# Patient Record
Sex: Male | Born: 1955 | Race: Black or African American | Hispanic: No | Marital: Single | Smoking: Current every day smoker
Health system: Southern US, Community
[De-identification: ages and names within clinical notes are randomized; demographics above are authoritative.]

## PROBLEM LIST (undated history)

## (undated) DIAGNOSIS — K8020 Calculus of gallbladder without cholecystitis without obstruction: Secondary | ICD-10-CM

## (undated) HISTORY — PX: CHEST TUBE INSERTION: SHX231

---

## 1999-10-04 ENCOUNTER — Emergency Department (HOSPITAL_COMMUNITY): Admission: EM | Admit: 1999-10-04 | Discharge: 1999-10-04 | Payer: Self-pay | Admitting: Emergency Medicine

## 1999-10-04 ENCOUNTER — Encounter: Payer: Self-pay | Admitting: Emergency Medicine

## 2001-03-08 ENCOUNTER — Emergency Department (HOSPITAL_COMMUNITY): Admission: EM | Admit: 2001-03-08 | Discharge: 2001-03-08 | Payer: Self-pay | Admitting: Emergency Medicine

## 2001-03-08 ENCOUNTER — Encounter: Payer: Self-pay | Admitting: Emergency Medicine

## 2001-03-10 ENCOUNTER — Emergency Department (HOSPITAL_COMMUNITY): Admission: EM | Admit: 2001-03-10 | Discharge: 2001-03-10 | Payer: Self-pay | Admitting: Emergency Medicine

## 2010-08-27 ENCOUNTER — Emergency Department (HOSPITAL_COMMUNITY)
Admission: EM | Admit: 2010-08-27 | Discharge: 2010-08-28 | Disposition: A | Discharge status: Home / Self Care | Payer: Self-pay | Attending: Emergency Medicine | Admitting: Emergency Medicine

## 2010-08-27 ENCOUNTER — Emergency Department (HOSPITAL_COMMUNITY): Payer: Self-pay

## 2010-08-27 DIAGNOSIS — F1010 Alcohol abuse, uncomplicated: Secondary | ICD-10-CM | POA: Insufficient documentation

## 2010-08-27 DIAGNOSIS — S0990XA Unspecified injury of head, initial encounter: Secondary | ICD-10-CM | POA: Insufficient documentation

## 2010-08-27 DIAGNOSIS — S0100XA Unspecified open wound of scalp, initial encounter: Secondary | ICD-10-CM | POA: Insufficient documentation

## 2010-10-09 ENCOUNTER — Emergency Department (HOSPITAL_COMMUNITY)
Admission: EM | Admit: 2010-10-09 | Discharge: 2010-10-09 | Disposition: A | Discharge status: Home / Self Care | Payer: Self-pay | Attending: Emergency Medicine | Admitting: Emergency Medicine

## 2010-10-09 DIAGNOSIS — Z4802 Encounter for removal of sutures: Secondary | ICD-10-CM | POA: Insufficient documentation

## 2012-12-04 ENCOUNTER — Emergency Department (HOSPITAL_COMMUNITY): Payer: Self-pay

## 2012-12-04 ENCOUNTER — Encounter (HOSPITAL_COMMUNITY): Payer: Self-pay | Admitting: *Deleted

## 2012-12-04 ENCOUNTER — Emergency Department (HOSPITAL_COMMUNITY)
Admission: EM | Admit: 2012-12-04 | Discharge: 2012-12-04 | Disposition: A | Discharge status: Home / Self Care | Payer: Self-pay | Attending: Emergency Medicine | Admitting: Emergency Medicine

## 2012-12-04 DIAGNOSIS — K8020 Calculus of gallbladder without cholecystitis without obstruction: Secondary | ICD-10-CM

## 2012-12-04 DIAGNOSIS — F17200 Nicotine dependence, unspecified, uncomplicated: Secondary | ICD-10-CM | POA: Insufficient documentation

## 2012-12-04 HISTORY — DX: Calculus of gallbladder without cholecystitis without obstruction: K80.20

## 2012-12-04 LAB — COMPREHENSIVE METABOLIC PANEL
BUN: 8 mg/dL (ref 6–23)
CO2: 28 mEq/L (ref 19–32)
Chloride: 98 mEq/L (ref 96–112)
Creatinine, Ser: 0.88 mg/dL (ref 0.50–1.35)
GFR calc non Af Amer: >90 mL/min (ref >90)
Total Bilirubin: 0.9 mg/dL (ref 0.3–1.2)

## 2012-12-04 LAB — CBC WITH DIFFERENTIAL/PLATELET
HCT: 39 % (ref 39.0–52.0)
Hemoglobin: 14 g/dL (ref 13.0–17.0)
Lymphocytes Relative: 30 % (ref 12–46)
Lymphs Abs: 1.8 10*3/uL (ref 0.7–4.0)
MCHC: 35.9 g/dL (ref 30.0–36.0)
Monocytes Absolute: 1.5 10*3/uL — ABNORMAL HIGH (ref 0.1–1.0)
Monocytes Relative: 24 % — ABNORMAL HIGH (ref 3–12)
Neutro Abs: 2.7 10*3/uL (ref 1.7–7.7)
WBC: 6 10*3/uL (ref 4.0–10.5)

## 2012-12-04 LAB — URINE MICROSCOPIC-ADD ON

## 2012-12-04 LAB — URINALYSIS, ROUTINE W REFLEX MICROSCOPIC
Bilirubin Urine: NEGATIVE
Glucose, UA: NEGATIVE mg/dL
Ketones, ur: NEGATIVE mg/dL
Leukocytes, UA: NEGATIVE
Nitrite: NEGATIVE
Protein, ur: 30 mg/dL — AB

## 2012-12-04 LAB — LIPASE, BLOOD: Lipase: 30 U/L (ref 11–59)

## 2012-12-04 LAB — TROPONIN I: Troponin I: <0.3 ng/mL (ref <0.30)

## 2012-12-04 MED ORDER — OXYCODONE HCL 5 MG PO TABS: 5.0000 mg | ORAL_TABLET | ORAL | Status: DC | PRN

## 2012-12-04 MED ORDER — HYDROMORPHONE HCL PF 1 MG/ML IJ SOLN
1.0000 mg | Freq: Once | INTRAMUSCULAR | Status: AC
Start: 2012-12-04 — End: 2012-12-04
  Administered 2012-12-04: 1 mg via INTRAVENOUS
  Filled 2012-12-04: qty 1

## 2012-12-04 MED ORDER — ONDANSETRON HCL 4 MG/2ML IJ SOLN
4.0000 mg | Freq: Once | INTRAMUSCULAR | Status: AC
  Administered 2012-12-04: 4 mg via INTRAVENOUS
  Filled 2012-12-04: qty 2

## 2012-12-04 NOTE — ED Notes (Signed)
RUQ pain that started Sunday and then mowed yesterday and pain increased.  No nvd or constipation or chest pain

## 2012-12-04 NOTE — ED Provider Notes (Signed)
History    CSN: [id number] Arrival date & time 12/04/12  6644  First MD Initiated Contact with Patient 12/04/12 0805     Chief Complaint  Patient presents with  . Abdominal Pain   (Consider location/radiation/quality/duration/timing/severity/associated sxs/prior Treatment) HPI Comments: Patient presents with a chief complaint of RUQ abdominal pain.  Pain has been present for the past 3 days.  He reports that the pain has been constant.  Pain radiates to his right shoulder.  He states that nothing makes the pain better and nothing makes the pain worse.  He has not taken anything for pain prior to arrival.  He has not noticed any association with eating or anything else.  He has not tried taking anything for the pain prior to arrival.  He reports that he has never had pain like this before.  He denies nausea, vomiting, or diarrhea.  Denies chest pain or SOB.  He denies fever or chills.  Denies urinary symptoms.  He denies prior history of Gallstones.  Patient does report that he drinks approximately 80 ounces of beer daily.  The history is provided by the patient.   History reviewed. No pertinent past medical history. History reviewed. No pertinent past surgical history. No family history on file. History  Substance Use Topics  . Smoking status: Current Every Day Smoker  . Smokeless tobacco: Not on file  . Alcohol Use: Yes     Comment: daily    Review of Systems  Gastrointestinal: Positive for abdominal pain.  All other systems reviewed and are negative.    Allergies  Review of patient's allergies indicates no known allergies.  Home Medications  No current outpatient prescriptions on file. BP 134/76  Pulse 95  Temp(Src) 98.1 F (36.7 C) (Oral)  Resp 20  SpO2 94% Physical Exam  Nursing note and vitals reviewed. Constitutional: He appears well-developed and well-nourished. No distress.  HENT:  Head: Normocephalic and atraumatic.  Mouth/Throat: Oropharynx is clear and  moist.  Neck: Normal range of motion. Neck supple.  Cardiovascular: Normal rate, regular rhythm and normal heart sounds.   Pulmonary/Chest: Effort normal and breath sounds normal.  Abdominal: Soft. Normal appearance and bowel sounds are normal. He exhibits no distension and no mass. There is tenderness in the right upper quadrant. There is positive Murphy's sign. There is no rigidity, no rebound, no guarding and no tenderness at McBurney's point.  Neurological: He is alert.  Skin: Skin is warm and dry. He is not diaphoretic.  Psychiatric: He has a normal mood and affect.    ED Course  Procedures (including critical care time) Labs Reviewed  CBC WITH DIFFERENTIAL  COMPREHENSIVE METABOLIC PANEL  LIPASE, BLOOD  URINALYSIS, ROUTINE W REFLEX MICROSCOPIC   US Abdomen Complete  12/04/2012   *RADIOLOGY REPORT*  Clinical Data:  Right upper quadrant abdominal pain.  The patient confirms alcohol consumption.  COMPLETE ABDOMINAL ULTRASOUND  Comparison:  None.  Findings:  Gallbladder:  Tumefactive sludge and scattered tiny shadowing gallstones.  No gallbladder wall thickening or pericholecystic fluid.  Negative sonographic Murphy's sign according to the ultrasound technologist.  Common bile duct:  Normal in caliber with maximum diameter approximating 5 mm.  Liver:  Diffusely increased and coarsened echotexture.  Numerous foci of heterogeneous echotexture in both lobes of the liver, the largest mass in the right lobe measuring approximately 6.3 x 5.4 x 5.6 cm.  Patent portal vein with hepatopetal flow.  IVC:  Patent.  Pancreas:  Although the pancreas is difficult to visualize in  its entirety, no focal pancreatic abnormality is identified.  Spleen:  Normal size and echotexture without focal parenchymal abnormality.  Right Kidney:  No hydronephrosis.  Well-preserved cortex.  No shadowing calculi.  Normal size and parenchymal echotexture without focal abnormalities.  Approximately 10.1 cm in length.  Left Kidney:   No hydronephrosis.  Well-preserved cortex.  No shadowing calculi.  Normal size and parenchymal echotexture without focal abnormalities.  Approximately 10.2 cm in length.  Abdominal aorta:  Normal in caliber throughout its visualized course in the abdomen without significant atherosclerosis.  The distal abdominal aorta was obscured by overlying bowel gas.  IMPRESSION:  1.  Tumefactive sludge and tiny gallstones within the gallbladder. No sonographic evidence of acute cholecystitis and no biliary ductal dilation. 2.  Hepatic steatosis.  Multiple hepatic masses versus multiple areas of focal fatty sparing throughout both lobes of the liver. CT of the abdomen with contrast may be helpful to make this determination. 3.  Remainder of the examination normal with a caveat that the distal abdominal aorta was obscured by overlying bowel gas and was therefore not evaluated.   Original Report Authenticated By: Hulan Saas, M.D.   No diagnosis found.   Date: 12/04/2012  Rate: 90  Rhythm: normal sinus rhythm  QRS Axis: normal  Intervals: normal  ST/T Wave abnormalities: normal  Conduction Disutrbances:none  Narrative Interpretation:   Old EKG Reviewed: none available    MDM  Patient presenting with RUQ abdominal pain that has been present for the past 3 days.  Labs unremarkable aside from elevated liver enzymes, which is consistent with the patient's history of Alcoholism.  Lipase WNL.  Troponin negative.  Patient afebrile.  Abdominal ultrasound results as above showing Gallstones without evidence of Cholecystitis and also hepatic masses vs multiple areas of focal fatty sparing throughout both lobes of the liver.      Pascal Lux [place], PA-C 12/04/12 1615  Pascal Lux [place], PA-C 12/04/12 1616

## 2012-12-06 NOTE — ED Provider Notes (Signed)
Medical screening examination/treatment/procedure(s) were conducted as a shared visit with non-physician practitioner(s) and myself.  I personally evaluated the patient during the encounter  Patient feels much better after treatment in the emergency department.  Patient with cholelithiasis without signs of cholecystitis.  Discharge home with general surgery followup  Lyanne Co, MD 12/06/12 2342

## 2013-05-30 ENCOUNTER — Encounter (HOSPITAL_COMMUNITY): Payer: Self-pay | Admitting: Emergency Medicine

## 2013-05-30 ENCOUNTER — Emergency Department (HOSPITAL_COMMUNITY): Payer: Medicaid Other

## 2013-05-30 ENCOUNTER — Inpatient Hospital Stay (HOSPITAL_COMMUNITY)
Admission: EM | Admit: 2013-05-30 | Discharge: 2013-06-17 | DRG: 981 | Disposition: A | Discharge status: Hospice | Payer: Medicaid Other | Attending: Internal Medicine | Admitting: Internal Medicine

## 2013-05-30 ENCOUNTER — Inpatient Hospital Stay (HOSPITAL_COMMUNITY): Payer: Medicaid Other

## 2013-05-30 DIAGNOSIS — K649 Unspecified hemorrhoids: Secondary | ICD-10-CM | POA: Diagnosis not present

## 2013-05-30 DIAGNOSIS — Z8249 Family history of ischemic heart disease and other diseases of the circulatory system: Secondary | ICD-10-CM

## 2013-05-30 DIAGNOSIS — E43 Unspecified severe protein-calorie malnutrition: Secondary | ICD-10-CM | POA: Diagnosis present

## 2013-05-30 DIAGNOSIS — E46 Unspecified protein-calorie malnutrition: Secondary | ICD-10-CM | POA: Diagnosis present

## 2013-05-30 DIAGNOSIS — C7889 Secondary malignant neoplasm of other digestive organs: Secondary | ICD-10-CM | POA: Diagnosis present

## 2013-05-30 DIAGNOSIS — R160 Hepatomegaly, not elsewhere classified: Secondary | ICD-10-CM | POA: Diagnosis present

## 2013-05-30 DIAGNOSIS — R578 Other shock: Secondary | ICD-10-CM

## 2013-05-30 DIAGNOSIS — E162 Hypoglycemia, unspecified: Secondary | ICD-10-CM | POA: Diagnosis not present

## 2013-05-30 DIAGNOSIS — E874 Mixed disorder of acid-base balance: Secondary | ICD-10-CM | POA: Diagnosis present

## 2013-05-30 DIAGNOSIS — I10 Essential (primary) hypertension: Secondary | ICD-10-CM | POA: Diagnosis present

## 2013-05-30 DIAGNOSIS — E872 Acidosis, unspecified: Secondary | ICD-10-CM | POA: Diagnosis present

## 2013-05-30 DIAGNOSIS — R791 Abnormal coagulation profile: Secondary | ICD-10-CM | POA: Diagnosis present

## 2013-05-30 DIAGNOSIS — E871 Hypo-osmolality and hyponatremia: Secondary | ICD-10-CM | POA: Diagnosis present

## 2013-05-30 DIAGNOSIS — K766 Portal hypertension: Secondary | ICD-10-CM | POA: Diagnosis present

## 2013-05-30 DIAGNOSIS — F10939 Alcohol use, unspecified with withdrawal, unspecified: Secondary | ICD-10-CM | POA: Diagnosis present

## 2013-05-30 DIAGNOSIS — R680 Hypothermia, not associated with low environmental temperature: Secondary | ICD-10-CM | POA: Diagnosis not present

## 2013-05-30 DIAGNOSIS — R180 Malignant ascites: Secondary | ICD-10-CM | POA: Diagnosis present

## 2013-05-30 DIAGNOSIS — Z66 Do not resuscitate: Secondary | ICD-10-CM | POA: Diagnosis not present

## 2013-05-30 DIAGNOSIS — R7401 Elevation of levels of liver transaminase levels: Secondary | ICD-10-CM | POA: Diagnosis present

## 2013-05-30 DIAGNOSIS — R1011 Right upper quadrant pain: Secondary | ICD-10-CM | POA: Diagnosis present

## 2013-05-30 DIAGNOSIS — E870 Hyperosmolality and hypernatremia: Secondary | ICD-10-CM | POA: Diagnosis not present

## 2013-05-30 DIAGNOSIS — B1920 Unspecified viral hepatitis C without hepatic coma: Secondary | ICD-10-CM | POA: Diagnosis present

## 2013-05-30 DIAGNOSIS — C787 Secondary malignant neoplasm of liver and intrahepatic bile duct: Secondary | ICD-10-CM

## 2013-05-30 DIAGNOSIS — R490 Dysphonia: Secondary | ICD-10-CM | POA: Diagnosis not present

## 2013-05-30 DIAGNOSIS — E860 Dehydration: Secondary | ICD-10-CM

## 2013-05-30 DIAGNOSIS — Z801 Family history of malignant neoplasm of trachea, bronchus and lung: Secondary | ICD-10-CM

## 2013-05-30 DIAGNOSIS — N10 Acute tubulo-interstitial nephritis: Secondary | ICD-10-CM | POA: Diagnosis present

## 2013-05-30 DIAGNOSIS — G9340 Encephalopathy, unspecified: Secondary | ICD-10-CM | POA: Diagnosis present

## 2013-05-30 DIAGNOSIS — C772 Secondary and unspecified malignant neoplasm of intra-abdominal lymph nodes: Secondary | ICD-10-CM | POA: Diagnosis present

## 2013-05-30 DIAGNOSIS — Z7189 Other specified counseling: Secondary | ICD-10-CM

## 2013-05-30 DIAGNOSIS — N179 Acute kidney failure, unspecified: Secondary | ICD-10-CM | POA: Diagnosis not present

## 2013-05-30 DIAGNOSIS — E876 Hypokalemia: Secondary | ICD-10-CM | POA: Diagnosis not present

## 2013-05-30 DIAGNOSIS — K769 Liver disease, unspecified: Secondary | ICD-10-CM | POA: Diagnosis present

## 2013-05-30 DIAGNOSIS — D649 Anemia, unspecified: Secondary | ICD-10-CM | POA: Diagnosis present

## 2013-05-30 DIAGNOSIS — IMO0002 Reserved for concepts with insufficient information to code with codable children: Secondary | ICD-10-CM

## 2013-05-30 DIAGNOSIS — F1020 Alcohol dependence, uncomplicated: Secondary | ICD-10-CM | POA: Diagnosis present

## 2013-05-30 DIAGNOSIS — Z23 Encounter for immunization: Secondary | ICD-10-CM

## 2013-05-30 DIAGNOSIS — K5900 Constipation, unspecified: Secondary | ICD-10-CM | POA: Diagnosis not present

## 2013-05-30 DIAGNOSIS — K652 Spontaneous bacterial peritonitis: Secondary | ICD-10-CM | POA: Diagnosis present

## 2013-05-30 DIAGNOSIS — I4729 Other ventricular tachycardia: Secondary | ICD-10-CM | POA: Diagnosis not present

## 2013-05-30 DIAGNOSIS — F17200 Nicotine dependence, unspecified, uncomplicated: Secondary | ICD-10-CM | POA: Diagnosis present

## 2013-05-30 DIAGNOSIS — K7689 Other specified diseases of liver: Secondary | ICD-10-CM | POA: Diagnosis present

## 2013-05-30 DIAGNOSIS — R5381 Other malaise: Secondary | ICD-10-CM | POA: Diagnosis not present

## 2013-05-30 DIAGNOSIS — D62 Acute posthemorrhagic anemia: Secondary | ICD-10-CM | POA: Diagnosis not present

## 2013-05-30 DIAGNOSIS — I472 Ventricular tachycardia, unspecified: Secondary | ICD-10-CM | POA: Diagnosis not present

## 2013-05-30 DIAGNOSIS — J9600 Acute respiratory failure, unspecified whether with hypoxia or hypercapnia: Secondary | ICD-10-CM | POA: Diagnosis present

## 2013-05-30 DIAGNOSIS — Z79899 Other long term (current) drug therapy: Secondary | ICD-10-CM

## 2013-05-30 DIAGNOSIS — E2749 Other adrenocortical insufficiency: Secondary | ICD-10-CM | POA: Diagnosis present

## 2013-05-30 DIAGNOSIS — G4700 Insomnia, unspecified: Secondary | ICD-10-CM | POA: Diagnosis not present

## 2013-05-30 DIAGNOSIS — Z598 Other problems related to housing and economic circumstances: Secondary | ICD-10-CM

## 2013-05-30 DIAGNOSIS — Z5987 Material hardship due to limited financial resources, not elsewhere classified: Secondary | ICD-10-CM

## 2013-05-30 DIAGNOSIS — F1410 Cocaine abuse, uncomplicated: Secondary | ICD-10-CM | POA: Diagnosis present

## 2013-05-30 DIAGNOSIS — Z515 Encounter for palliative care: Secondary | ICD-10-CM

## 2013-05-30 DIAGNOSIS — I469 Cardiac arrest, cause unspecified: Secondary | ICD-10-CM | POA: Diagnosis present

## 2013-05-30 DIAGNOSIS — G9349 Other encephalopathy: Secondary | ICD-10-CM | POA: Diagnosis present

## 2013-05-30 DIAGNOSIS — F10239 Alcohol dependence with withdrawal, unspecified: Secondary | ICD-10-CM | POA: Diagnosis present

## 2013-05-30 DIAGNOSIS — Z833 Family history of diabetes mellitus: Secondary | ICD-10-CM

## 2013-05-30 DIAGNOSIS — C228 Malignant neoplasm of liver, primary, unspecified as to type: Principal | ICD-10-CM | POA: Diagnosis present

## 2013-05-30 DIAGNOSIS — K229 Disease of esophagus, unspecified: Secondary | ICD-10-CM | POA: Diagnosis present

## 2013-05-30 HISTORY — DX: Calculus of gallbladder without cholecystitis without obstruction: K80.20

## 2013-05-30 LAB — LACTATE DEHYDROGENASE: LDH: 1221 U/L — ABNORMAL HIGH (ref 94–250)

## 2013-05-30 LAB — HEPATITIS PANEL, ACUTE
HCV Ab: REACTIVE — AB
Hep A IgM: NONREACTIVE
Hep B C IgM: NONREACTIVE
Hepatitis B Surface Ag: NEGATIVE

## 2013-05-30 LAB — IRON AND TIBC: Saturation Ratios: 23 % (ref 20–55)

## 2013-05-30 LAB — BASIC METABOLIC PANEL WITH GFR
BUN: 28 mg/dL — ABNORMAL HIGH (ref 6–23)
CO2: 15 meq/L — ABNORMAL LOW (ref 19–32)
Calcium: 8.3 mg/dL — ABNORMAL LOW (ref 8.4–10.5)
Chloride: 100 meq/L (ref 96–112)
Creatinine, Ser: 1.36 mg/dL — ABNORMAL HIGH (ref 0.50–1.35)
GFR calc Af Amer: 65 mL/min — ABNORMAL LOW (ref >90)
GFR calc non Af Amer: 56 mL/min — ABNORMAL LOW (ref >90)
Glucose, Bld: 100 mg/dL — ABNORMAL HIGH (ref 70–99)
Potassium: 6 meq/L — ABNORMAL HIGH (ref 3.5–5.1)
Sodium: 130 meq/L — ABNORMAL LOW (ref 135–145)

## 2013-05-30 LAB — CBC
HCT: 18.1 % — ABNORMAL LOW (ref 39.0–52.0)
HCT: 25.6 % — ABNORMAL LOW (ref 39.0–52.0)
Hemoglobin: 6.4 g/dL — CL (ref 13.0–17.0)
Hemoglobin: 8.9 g/dL — ABNORMAL LOW (ref 13.0–17.0)
MCH: 31.8 pg (ref 26.0–34.0)
MCHC: 34.8 g/dL (ref 30.0–36.0)
MCV: 91.4 fL (ref 78.0–100.0)
MCV: 91.4 fL (ref 78.0–100.0)
Platelets: 317 10*3/uL (ref 150–400)
Platelets: 267 K/uL (ref 150–400)
RBC: 1.98 MIL/uL — ABNORMAL LOW (ref 4.22–5.81)
RBC: 2.8 MIL/uL — ABNORMAL LOW (ref 4.22–5.81)
RDW: 17.8 % — ABNORMAL HIGH (ref 11.5–15.5)
RDW: 15.9 % — ABNORMAL HIGH (ref 11.5–15.5)
WBC: 9 10*3/uL (ref 4.0–10.5)
WBC: 13.3 K/uL — ABNORMAL HIGH (ref 4.0–10.5)

## 2013-05-30 LAB — COMPREHENSIVE METABOLIC PANEL
ALT: 76 U/L — ABNORMAL HIGH (ref 0–53)
AST: 245 U/L — ABNORMAL HIGH (ref 0–37)
Alkaline Phosphatase: 682 U/L — ABNORMAL HIGH (ref 39–117)
GFR calc Af Amer: 77 mL/min — ABNORMAL LOW (ref >90)
Glucose, Bld: 106 mg/dL — ABNORMAL HIGH (ref 70–99)
Potassium: 4.7 mEq/L (ref 3.5–5.1)
Sodium: 129 mEq/L — ABNORMAL LOW (ref 135–145)
Total Protein: 8.2 g/dL (ref 6.0–8.3)

## 2013-05-30 LAB — CBC WITH DIFFERENTIAL/PLATELET
Basophils Absolute: 0 10*3/uL (ref 0.0–0.1)
Basophils Relative: 0 % (ref 0–1)
Eosinophils Absolute: 0 10*3/uL (ref 0.0–0.7)
Eosinophils Relative: 0 % (ref 0–5)
HCT: 20.7 % — ABNORMAL LOW (ref 39.0–52.0)
Hemoglobin: 7.3 g/dL — ABNORMAL LOW (ref 13.0–17.0)
MCH: 32.3 pg (ref 26.0–34.0)
MCHC: 35.3 g/dL (ref 30.0–36.0)
MCV: 91.6 fL (ref 78.0–100.0)
Monocytes Absolute: 0.9 10*3/uL (ref 0.1–1.0)
Monocytes Relative: 11 % (ref 3–12)
Neutro Abs: 6.5 10*3/uL (ref 1.7–7.7)
RDW: 17.5 % — ABNORMAL HIGH (ref 11.5–15.5)

## 2013-05-30 LAB — PREPARE RBC (CROSSMATCH)

## 2013-05-30 LAB — ABO/RH: ABO/RH(D): O POS

## 2013-05-30 LAB — RETICULOCYTES
RBC.: 1.98 MIL/uL — ABNORMAL LOW (ref 4.22–5.81)
Retic Count, Absolute: 132.7 10*3/uL (ref 19.0–186.0)

## 2013-05-30 LAB — APTT: aPTT: 32 s (ref 24–37)

## 2013-05-30 LAB — TECHNOLOGIST SMEAR REVIEW

## 2013-05-30 LAB — LACTIC ACID, PLASMA: Lactic Acid, Venous: 5.1 mmol/L — ABNORMAL HIGH (ref 0.5–2.2)

## 2013-05-30 LAB — MRSA PCR SCREENING: MRSA by PCR: NEGATIVE

## 2013-05-30 LAB — PROTIME-INR
INR: 1.17 (ref 0.00–1.49)
Prothrombin Time: 14.7 seconds (ref 11.6–15.2)

## 2013-05-30 LAB — HIV ANTIBODY (ROUTINE TESTING W REFLEX): HIV: NONREACTIVE

## 2013-05-30 MED ORDER — LORAZEPAM 2 MG/ML IJ SOLN: 1.0000 mg | Freq: Four times a day (QID) | INTRAMUSCULAR | Status: DC | PRN

## 2013-05-30 MED ORDER — ONDANSETRON HCL 4 MG/2ML IJ SOLN
4.0000 mg | Freq: Once | INTRAMUSCULAR | Status: AC
  Administered 2013-05-30: 4 mg via INTRAVENOUS
  Filled 2013-05-30: qty 2

## 2013-05-30 MED ORDER — PNEUMOCOCCAL VAC POLYVALENT 25 MCG/0.5ML IJ INJ
0.5000 mL | INJECTION | INTRAMUSCULAR | Status: AC
  Administered 2013-05-31: 0.5 mL via INTRAMUSCULAR
  Filled 2013-05-30: qty 0.5

## 2013-05-30 MED ORDER — LORAZEPAM 1 MG PO TABS: 1.0000 mg | ORAL_TABLET | Freq: Four times a day (QID) | ORAL | Status: DC | PRN

## 2013-05-30 MED ORDER — SODIUM CHLORIDE 0.9 % IV SOLN
INTRAVENOUS | Status: AC
  Administered 2013-05-30: 14:00:00 via INTRAVENOUS

## 2013-05-30 MED ORDER — SODIUM CHLORIDE 0.9 % IV SOLN
2.0000 g | Freq: Once | INTRAVENOUS | Status: DC
  Filled 2013-05-30: qty 20

## 2013-05-30 MED ORDER — FOLIC ACID 1 MG PO TABS
1.0000 mg | ORAL_TABLET | Freq: Every day | ORAL | Status: DC
  Administered 2013-05-30 – 2013-06-04 (×5): 1 mg via ORAL
  Filled 2013-05-30 (×6): qty 1

## 2013-05-30 MED ORDER — SODIUM CHLORIDE 0.9 % IJ SOLN
3.0000 mL | INTRAMUSCULAR | Status: DC | PRN
  Administered 2013-06-06 (×2): 3 mL via INTRAVENOUS

## 2013-05-30 MED ORDER — ADULT MULTIVITAMIN W/MINERALS CH
1.0000 | ORAL_TABLET | Freq: Every day | ORAL | Status: DC
  Administered 2013-05-30 – 2013-06-04 (×5): 1 via ORAL
  Filled 2013-05-30 (×6): qty 1

## 2013-05-30 MED ORDER — SODIUM CHLORIDE 0.9 % IV SOLN: 250.0000 mL | INTRAVENOUS | Status: DC | PRN

## 2013-05-30 MED ORDER — ENOXAPARIN SODIUM 40 MG/0.4ML ~~LOC~~ SOLN
40.0000 mg | SUBCUTANEOUS | Status: DC
  Filled 2013-05-30: qty 0.4

## 2013-05-30 MED ORDER — IOHEXOL 300 MG/ML  SOLN
100.0000 mL | Freq: Once | INTRAMUSCULAR | Status: AC | PRN
  Administered 2013-05-30: 100 mL via INTRAVENOUS

## 2013-05-30 MED ORDER — MORPHINE SULFATE 2 MG/ML IJ SOLN
2.0000 mg | INTRAMUSCULAR | Status: DC | PRN
  Administered 2013-05-30 – 2013-06-01 (×3): 2 mg via INTRAVENOUS
  Filled 2013-05-30 (×3): qty 1

## 2013-05-30 MED ORDER — THIAMINE HCL 100 MG/ML IJ SOLN
100.0000 mg | Freq: Every day | INTRAMUSCULAR | Status: DC
  Administered 2013-05-30 – 2013-06-04 (×2): 100 mg via INTRAVENOUS
  Filled 2013-05-30 (×6): qty 1

## 2013-05-30 MED ORDER — IOHEXOL 300 MG/ML  SOLN
25.0000 mL | INTRAMUSCULAR | Status: AC
  Administered 2013-05-30 (×2): 25 mL via ORAL

## 2013-05-30 MED ORDER — SODIUM CHLORIDE 0.9 % IV SOLN
INTRAVENOUS | Status: DC
  Administered 2013-05-30: 12:00:00 via INTRAVENOUS

## 2013-05-30 MED ORDER — MORPHINE SULFATE 4 MG/ML IJ SOLN
4.0000 mg | Freq: Once | INTRAMUSCULAR | Status: AC
  Administered 2013-05-30: 4 mg via INTRAVENOUS
  Filled 2013-05-30: qty 1

## 2013-05-30 MED ORDER — VITAMIN B-1 100 MG PO TABS
100.0000 mg | ORAL_TABLET | Freq: Every day | ORAL | Status: DC
  Administered 2013-05-31 – 2013-06-03 (×3): 100 mg via ORAL
  Filled 2013-05-30 (×6): qty 1

## 2013-05-30 MED ORDER — SODIUM CHLORIDE 0.9 % IJ SOLN
3.0000 mL | Freq: Two times a day (BID) | INTRAMUSCULAR | Status: DC
  Administered 2013-05-30 – 2013-06-17 (×29): 3 mL via INTRAVENOUS

## 2013-05-30 NOTE — Progress Notes (Signed)
Moved pt to 2C 01. Report given.  Peter Congo RN
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
## 2013-05-30 NOTE — ED Notes (Signed)
Abdominal pain with n/v began 6 months ago

## 2013-05-30 NOTE — Progress Notes (Signed)
Micheal Daniel [id number] Admission Data: 05/30/2013 1:56 PM Attending Provider: Farley Ly, MD  PCP:No PCP Per Patient Consults/ Treatment Team:    Micheal Daniel is a 57 y.o. male patient admitted from ED awake, alert  & orientated  X 3,  Full Code, VSS - Blood pressure 94/63, pulse 87, temperature 97.8 F (36.6 C), temperature source Oral, resp. rate 20, SpO2 100.00%.,, no c/o shortness of breath, no c/o chest pain, no distress noted.  IV site WDL:  antecubital right, condition patent and no redness with a transparent dsg that's clean dry and intact.  Allergies:  No Known Allergies   Past Medical History  Diagnosis Date  . Cholelithiasis 12/04/12    ED visit for RUQ abd pain, was supposed to follow up with gen surg, but lost to follow up    History:  obtained from the patient.   Pt orientation to unit, room and routine. Information packet given to patient/family and safety video watched.  Admission INP armband ID verified with patient/family, and in place. SR up x 2, fall risk assessment complete with Patient and family verbalizing understanding of risks associated with falls. Pt verbalizes an understanding of how to use the call bell and to call for help before getting out of bed.  Skin, clean-dry- intact without evidence of bruising,  skin tear on left wrist.        Will cont to monitor and assist as needed.  Cindra Eves, RN 05/30/2013 1:56 PM

## 2013-05-30 NOTE — H&P (Signed)
Internal Medicine Attending Admission Note Date: 05/30/2013  Patient name: Micheal Daniel Medical record number: [id number] Date of birth: [birth year] Age: 57 y.o. Gender: male  I saw and evaluated the patient. I reviewed the resident's note and I agree with the resident's findings and plan as documented in the resident's note, with the following additional comments..  Chief Complaint(s): Right upper quadrant abdominal pain  History - key components related to admission: Patient is a 57 year old man with history of cholelithiasis and other problems as outlined in the medical history, admitted with complaint of right upper quadrant pain.  Patient says that he was seen in the emergency department about 6 months ago for similar pain and diagnosed with gallstones; he reports that he was on an oral pain medication for a short time, but the pain resolved.  The pain recurred about 2 days prior to admission; it was associated with at least one episode of nausea and vomiting.  He denies fever, chills, sweats, hematemesis, bright red blood per rectum, melena, dysuria, or urinary frequency.  He says that he drinks about two 40 ounce beers per day chronically, and has drunk more in the past.  He also smokes, currently about 5 cigarettes per day.  Physical Exam - key components related to admission:  Filed Vitals:   05/30/13 1229 05/30/13 1252 05/30/13 1255 05/30/13 1256  BP: 125/86 128/73 118/78 94/63  Pulse: 97 84 87   Temp: 97.8 F (36.6 C) 97.8 F (36.6 C)    TempSrc: Oral Oral    Resp: 20 20    SpO2: 98% 100%     General: Alert, oriented Lungs: Clear Back: No CVA tenderness Heart: Regular; S1-S2, no S3, no S4, no murmurs Abdomen: Bowel sounds present, soft; moderate right upper quadrant tenderness; liver margin percusses about 6 cm below the right costal margin; no apparent splenomegaly.   Extremities: No edema  Lab results:   Basic Metabolic Panel:  Recent Labs  40/98/11 0850  NA 129*   K 4.7  CL 96  CO2 18*  GLUCOSE 106*  BUN 22  CREATININE 1.18  CALCIUM 9.0    Liver Function Tests:  Recent Labs  05/30/13 0850  AST 245*  ALT 76*  ALKPHOS 682*  BILITOT 2.0*  PROT 8.2  ALBUMIN 2.2*    Recent Labs  05/30/13 0850  LIPASE 23     CBC:  Recent Labs  05/30/13 0850 05/30/13 1350  WBC 8.7 9.0  HGB 7.3* 6.4*  HCT 20.7* 18.1*  MCV 91.6 91.4  PLT 346 317    Recent Labs  05/30/13 0850  NEUTROABS 6.5  LYMPHSABS 1.3  MONOABS 0.9  EOSABS 0.0  BASOSABS 0.0    Anemia Panel:  Recent Labs  05/30/13 1350  TIBC 264  IRON 61  RETICCTPCT 6.7*    Coagulation:  Recent Labs  05/30/13 1633  INR 1.17    HIV  Date Value Range Status  05/30/2013 NON REACTIVE  NON REACTIVE Final     Performed at [facility]     LDH  Date Value Range Status  05/30/2013 1221* 94 - 250 U/L Final     Urinalysis    Component Value Date/Time   COLORURINE AMBER* 12/04/2012 0846   APPEARANCEUR CLEAR 12/04/2012 0846   LABSPEC 1.012 12/04/2012 0846   PHURINE 6.0 12/04/2012 0846   GLUCOSEU NEGATIVE 12/04/2012 0846   HGBUR NEGATIVE 12/04/2012 0846   BILIRUBINUR NEGATIVE 12/04/2012 0846   KETONESUR NEGATIVE 12/04/2012 0846   PROTEINUR 30*  12/04/2012 0846   UROBILINOGEN 1.0 12/04/2012 0846   NITRITE NEGATIVE 12/04/2012 0846   LEUKOCYTESUR NEGATIVE 12/04/2012 0846     Imaging results:  Dg Chest 2 View  05/30/2013   CLINICAL DATA:  Concerning areas found within the liver.  EXAM: CHEST  2 VIEW  COMPARISON:  None.  FINDINGS: Low lung volumes. The cardiac silhouette is in normal limits. Areas of a ill-defined nodular density project within the right mid lung, right upper lobe region, and left lung base. Further evaluation chest CT project fluid considering the liver findings is recommended. No focal regions of consolidation identified. Visualized osseous structures are unremarkable.  IMPRESSION: Multiple areas of ill-defined vague densities with a nodular  appearance. Further evaluation with contrasted chest CT is recommended.   Electronically Signed   By: Salome Holmes M.D.   On: 05/30/2013 14:48   US Abdomen Complete  05/30/2013   CLINICAL DATA:  Pain.  EXAM: ULTRASOUND ABDOMEN COMPLETE  COMPARISON:  12/04/2012 abdominal ultrasound.  FINDINGS: Gallbladder:  Again noted is sludge in the gallbladder. Gallbladder wall thickness is normal at 1.9 mm. Negative Murphy's sign. No pericholecystic fluid.  Common bile duct:  Diameter: 3.1 mm.  Liver:  Echotexture is grossly abnormal with severe heterogeneity. Probable hepatomegaly. There are multiple hepatic masses, many of which are hyperechoic. The largest on today's exam measures 3.9 cm in maximum diameter. These lesions are very suspicious for metastatic disease. Portal vein patent with normal directional flow. CT of the abdomen and pelvis suggested for further evaluation.  IVC:  No abnormality visualized.  Pancreas:  Visualized portion unremarkable.  Spleen:  Size and appearance within normal limits.  Right Kidney:  Length: 9.7 cm. Echogenicity within normal limits. No mass or hydronephrosis visualized.  Left Kidney:  Length: 9.2 cm. Echogenicity within normal limits. No mass or hydronephrosis visualized.  Abdominal aorta:  No aneurysm visualized.  Other findings:  Complex ascites.  This suggests malignant ascites.  IMPRESSION: Innumerable hepatic masses consistent with metastatic disease. Associated ascites present. CT of the abdomen pelvis suggested for further evaluation.   Electronically Signed   By: Maisie Fus  Register   On: 05/30/2013 09:58    Other results: EKG: Sinus rhythm, normal EKG..  Assessment & Plan by Problem:  1.  Right upper quadrant pain, with abdominal ultrasound finding of multiple hepatic masses consistent with metastatic disease.  Plan is CT scan of the abdomen and pelvis; will then consider best approach to obtaining tissue for diagnosis, possibly a CT-guided percutaneous biopsy.  We  discussed the ultrasound findings and the planned workup with the patient.  2.  Vague densities on chest x-ray.  Plan is CT scan of the chest to evaluate.  3.  Elevated anion gap of 15 and low bicarbonate of 18 consistent with metabolic acidosis.  Patient's lactic acid level is elevated, which is likely the source of the acidosis, although there may be a contribution from alcoholic ketoacidosis.  The likely source of the lactic acidosis is the liver process; patient is not hypotensive or hypoxic, and his symptoms do not suggest bowel ischemia.  Plan is follow electrolytes, renal function, and lactic acid level; CT scan of the abdomen and pelvis as above; supportive care.  4.  Normocytic anemia.  Plan is transfuse to hemoglobin greater than 7; follow hemoglobin and transfuse as indicated; check stool for fecal occult blood.  5.  Other problems and plans as per the resident physician's note.

## 2013-05-30 NOTE — Progress Notes (Signed)
CRITICAL VALUE ALERT  Critical value received:  Hgb 6.4  Date of notification:  12/18  Time of notification:  1430  Critical value read back:yes  Nurse who received alert:  Peter Congo  MD notified (1st page):  Delane Ginger  Time of first page:  1443  MD notified (2nd page):   Time of second page:  Responding MD:  Delane Ginger  Time MD responded:  1450

## 2013-05-30 NOTE — ED Provider Notes (Signed)
CSN: [id number]     Arrival date & time 05/30/13  0820 History   First MD Initiated Contact with Patient 05/30/13 [id number]     Chief Complaint  Patient presents with  . Abdominal Pain    Patient is a 57 y.o. male presenting with abdominal pain. The history is provided by the patient.  Abdominal Pain Pain location:  RUQ Pain quality: sharp   Pain radiates to:  Does not radiate Pain severity:  Moderate Onset quality:  Gradual Duration:  1 day Timing:  Intermittent Progression:  Worsening Chronicity:  Recurrent Relieved by:  Nothing Worsened by:  Palpation Associated symptoms: nausea and vomiting   Associated symptoms: no chest pain, no fever, no hematemesis, no hematochezia and no melena   pt reports having this pain earlier this year and was found to have gallstones but was never able to f/u He reports the pain has since return He admits to frequent ETOH use He denies known h/o ulcers and denies dark/bloody stools No CP is reported  PMH - none History reviewed. No pertinent past surgical history. No family history on file. History  Substance Use Topics  . Smoking status: Current Every Day Smoker  . Smokeless tobacco: Not on file  . Alcohol Use: Yes     Comment: daily    Review of Systems  Constitutional: Negative for fever.  Cardiovascular: Negative for chest pain.  Gastrointestinal: Positive for nausea, vomiting and abdominal pain. Negative for melena, hematochezia and hematemesis.  All other systems reviewed and are negative.    Allergies  Review of patient's allergies indicates no known allergies.  Home Medications   Current Outpatient Rx  Name  Route  Sig  Dispense  Refill  . naproxen sodium (ANAPROX) 220 MG tablet   Oral   Take 220 mg by mouth every 6 (six) hours as needed (for pain).          BP 133/76  Pulse 98  Temp(Src) 97.5 F (36.4 C) (Oral)  Resp 20  SpO2 100% Physical Exam CONSTITUTIONAL: Well developed/well nourished HEAD:  Normocephalic/atraumatic EYES: EOMI/PERRL, icterus noted ENMT: Mucous membranes moist NECK: supple no meningeal signs SPINE:entire spine nontender CV: S1/S2 noted, no murmurs/rubs/gallops noted LUNGS: Lungs are clear to auscultation bilaterally, no apparent distress ABDOMEN: soft, moderate RUQ tenderness noted, no rebound or guarding GU:no cva tenderness NEURO: Pt is awake/alert, moves all extremitiesx4 Rectal - stool color brown, no blood or melena noted EXTREMITIES: pulses normal, full ROM SKIN: warm, color normal PSYCH: no abnormalities of mood noted  ED Course  Procedures (including critical care time) 8:47 AM Pt with h/o cholelithiasis reporting return of RUQ pain Will obtain US/labs 11:00 AM US shows hepatic metastasis Pt agrees to admission D/w medicine resident, will admit  Labs Review Labs Reviewed  COMPREHENSIVE METABOLIC PANEL  CBC WITH DIFFERENTIAL  LIPASE, BLOOD   Imaging Review No results found.  EKG Interpretation    Date/Time:  Thursday May 30 2013 09:46:47 EST Ventricular Rate:  82 PR Interval:  138 QRS Duration: 72 QT Interval:  388 QTC Calculation: 453 R Axis:   68 Text Interpretation:  Sinus rhythm Normal ECG No significant change since last tracing Confirmed by Bebe Shaggy  MD, Gyneth Hubka [phone]) on 05/30/2013 10:02:53 AM            MDM  No diagnosis found. Nursing notes including past medical history and social history reviewed and considered in documentation Labs/vital reviewed and considered Previous records reviewed and considered - h/o cholelithiasis  Joya Gaskins, MD 05/30/13 1100

## 2013-05-31 ENCOUNTER — Encounter (HOSPITAL_COMMUNITY): Payer: Self-pay | Admitting: *Deleted

## 2013-05-31 ENCOUNTER — Encounter (HOSPITAL_COMMUNITY)
Admission: EM | Disposition: A | Discharge status: Hospice | Payer: Self-pay | Source: Home / Self Care | Attending: Internal Medicine

## 2013-05-31 DIAGNOSIS — E43 Unspecified severe protein-calorie malnutrition: Secondary | ICD-10-CM | POA: Diagnosis present

## 2013-05-31 HISTORY — PX: ESOPHAGOGASTRODUODENOSCOPY: SHX5428

## 2013-05-31 LAB — CBC WITH DIFFERENTIAL/PLATELET
Eosinophils Absolute: 0 10*3/uL (ref 0.0–0.7)
Eosinophils Relative: 0 % (ref 0–5)
Hemoglobin: 7.3 g/dL — ABNORMAL LOW (ref 13.0–17.0)
Lymphs Abs: 1.2 10*3/uL (ref 0.7–4.0)
MCH: 32.2 pg (ref 26.0–34.0)
MCHC: 36 g/dL (ref 30.0–36.0)
MCV: 89.4 fL (ref 78.0–100.0)
Monocytes Absolute: 1.2 10*3/uL — ABNORMAL HIGH (ref 0.1–1.0)
Monocytes Relative: 13 % — ABNORMAL HIGH (ref 3–12)
Neutro Abs: 7.1 10*3/uL (ref 1.7–7.7)
RBC: 2.27 MIL/uL — ABNORMAL LOW (ref 4.22–5.81)

## 2013-05-31 LAB — COMPREHENSIVE METABOLIC PANEL
ALT: 299 U/L — ABNORMAL HIGH (ref 0–53)
AST: 1479 U/L — ABNORMAL HIGH (ref 0–37)
AST: 2059 U/L — ABNORMAL HIGH (ref 0–37)
Albumin: 1.8 g/dL — ABNORMAL LOW (ref 3.5–5.2)
Albumin: 1.8 g/dL — ABNORMAL LOW (ref 3.5–5.2)
Alkaline Phosphatase: 644 U/L — ABNORMAL HIGH (ref 39–117)
BUN: 30 mg/dL — ABNORMAL HIGH (ref 6–23)
CO2: 18 mEq/L — ABNORMAL LOW (ref 19–32)
Calcium: 8.1 mg/dL — ABNORMAL LOW (ref 8.4–10.5)
Calcium: 7.6 mg/dL — ABNORMAL LOW (ref 8.4–10.5)
Chloride: 101 mEq/L (ref 96–112)
Chloride: 102 mEq/L (ref 96–112)
Creatinine, Ser: 1.43 mg/dL — ABNORMAL HIGH (ref 0.50–1.35)
Creatinine, Ser: 1.43 mg/dL — ABNORMAL HIGH (ref 0.50–1.35)
GFR calc Af Amer: 61 mL/min — ABNORMAL LOW (ref >90)
GFR calc Af Amer: 61 mL/min — ABNORMAL LOW (ref >90)
GFR calc non Af Amer: 53 mL/min — ABNORMAL LOW (ref >90)
Glucose, Bld: 83 mg/dL (ref 70–99)
Potassium: 4.4 mEq/L (ref 3.5–5.1)
Sodium: 132 mEq/L — ABNORMAL LOW (ref 135–145)
Total Bilirubin: 2.3 mg/dL — ABNORMAL HIGH (ref 0.3–1.2)
Total Protein: 7.4 g/dL (ref 6.0–8.3)

## 2013-05-31 LAB — CBC
Hemoglobin: 8.2 g/dL — ABNORMAL LOW (ref 13.0–17.0)
MCH: 31.5 pg (ref 26.0–34.0)
MCHC: 35.1 g/dL (ref 30.0–36.0)
MCV: 89.8 fL (ref 78.0–100.0)
MCV: 89.7 fL (ref 78.0–100.0)
Platelets: 281 10*3/uL (ref 150–400)
Platelets: 288 10*3/uL (ref 150–400)
RBC: 2.66 MIL/uL — ABNORMAL LOW (ref 4.22–5.81)
RBC: 2.32 MIL/uL — ABNORMAL LOW (ref 4.22–5.81)
RDW: 17.2 % — ABNORMAL HIGH (ref 11.5–15.5)
WBC: 12.2 10*3/uL — ABNORMAL HIGH (ref 4.0–10.5)

## 2013-05-31 LAB — PHOSPHORUS
Phosphorus: 5.5 mg/dL — ABNORMAL HIGH (ref 2.3–4.6)
Phosphorus: 5.1 mg/dL — ABNORMAL HIGH (ref 2.3–4.6)

## 2013-05-31 LAB — VITAMIN B12: Vitamin B-12: 795 pg/mL (ref 211–911)

## 2013-05-31 LAB — BASIC METABOLIC PANEL
CO2: 21 mEq/L (ref 19–32)
Chloride: 102 mEq/L (ref 96–112)
Creatinine, Ser: 1.47 mg/dL — ABNORMAL HIGH (ref 0.50–1.35)
GFR calc non Af Amer: 51 mL/min — ABNORMAL LOW (ref >90)
Glucose, Bld: 97 mg/dL (ref 70–99)

## 2013-05-31 LAB — CK TOTAL AND CKMB (NOT AT ARMC)
CK, MB: 3.9 ng/mL (ref 0.3–4.0)
Relative Index: 1.7 (ref 0.0–2.5)

## 2013-05-31 LAB — CK: Total CK: 158 U/L (ref 7–232)

## 2013-05-31 LAB — FOLATE: Folate: 15.5 ng/mL

## 2013-05-31 LAB — MAGNESIUM: Magnesium: 2.2 mg/dL (ref 1.5–2.5)

## 2013-05-31 SURGERY — EGD (ESOPHAGOGASTRODUODENOSCOPY)
Anesthesia: Moderate Sedation

## 2013-05-31 MED ORDER — DIPHENHYDRAMINE HCL 50 MG/ML IJ SOLN
INTRAMUSCULAR | Status: AC
  Filled 2013-05-31: qty 1

## 2013-05-31 MED ORDER — MIDAZOLAM HCL 10 MG/2ML IJ SOLN
INTRAMUSCULAR | Status: DC | PRN
  Administered 2013-05-31 (×2): 2 mg via INTRAVENOUS
  Administered 2013-05-31: 1 mg via INTRAVENOUS

## 2013-05-31 MED ORDER — MIDAZOLAM HCL 5 MG/ML IJ SOLN
INTRAMUSCULAR | Status: AC
  Filled 2013-05-31: qty 2

## 2013-05-31 MED ORDER — SODIUM CHLORIDE 0.9 % IV SOLN: INTRAVENOUS | Status: DC

## 2013-05-31 MED ORDER — SODIUM POLYSTYRENE SULFONATE 15 GM/60ML PO SUSP
15.0000 g | Freq: Once | ORAL | Status: AC
  Administered 2013-05-31: 15 g via ORAL
  Filled 2013-05-31: qty 60

## 2013-05-31 MED ORDER — SODIUM CHLORIDE 0.9 % IV SOLN
INTRAVENOUS | Status: DC
  Administered 2013-05-31 – 2013-06-04 (×8): via INTRAVENOUS

## 2013-05-31 MED ORDER — BUTAMBEN-TETRACAINE-BENZOCAINE 2-2-14 % EX AERO
INHALATION_SPRAY | CUTANEOUS | Status: DC | PRN
  Administered 2013-05-31: 2 via TOPICAL

## 2013-05-31 MED ORDER — FENTANYL CITRATE 0.05 MG/ML IJ SOLN
INTRAMUSCULAR | Status: DC | PRN
  Administered 2013-05-31: 25 ug via INTRAVENOUS
  Administered 2013-06-01: 100 ug

## 2013-05-31 MED ORDER — ALLOPURINOL 100 MG PO TABS
200.0000 mg | ORAL_TABLET | Freq: Two times a day (BID) | ORAL | Status: DC
  Administered 2013-05-31: 200 mg via ORAL
  Filled 2013-05-31 (×3): qty 2

## 2013-05-31 MED ORDER — FENTANYL CITRATE 0.05 MG/ML IJ SOLN
INTRAMUSCULAR | Status: AC
  Filled 2013-05-31: qty 2

## 2013-05-31 NOTE — Op Note (Signed)
[facility] [address] [place], [address]   OPERATIVE PROCEDURE REPORT  PATIENT: Micheal Daniel, Micheal Daniel  MR#: [id number] BIRTHDATE: [birth year]  GENDER: Male ENDOSCOPIST: Jeani Hawking, MD ASSISTANT:   Beryle Beams, technician and Jamal Maes, RN PROCEDURE DATE: 05/31/2013 PROCEDURE:   EGD ASA CLASS:   Class III INDICATIONS:Abnormal CT scan MEDICATIONS: Versed 2 mg IV and Fentanyl 25 mcg IV TOPICAL ANESTHETIC:   none  DESCRIPTION OF PROCEDURE:   After the risks benefits and alternatives of the procedure were thoroughly explained, informed consent was obtained.  The Pentax Gastroscope [id number]  endoscope was introduced through the mouth  and advanced to the second portion of the duodenum Without limitations.      The instrument was slowly withdrawn as the mucosa was fully examined.      FINDINGS: In the mid esophagus there was evidence of a web versus tonic contraction of the esophagus.  No evidence of an intraluminal mass in the distal esophagus.  A 3 cm sliding hiatal hernia was identified.  No malignant lesions noted in the gastric and duodenal mucosa.   Retroflexed views revealed no abnormalities.     The scope was then withdrawn from the patient and the procedure terminated.  COMPLICATIONS: There were no complications.  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 2) 2-3 cm sliding hiatal hernia.  RECOMMENDATIONS: 1) A EUS with FNA can be pursued if Radiology does not want to attempt any liver biopsy.  _______________________________ eSignedJeani Hawking, MD 05/31/2013 3:54 PM

## 2013-05-31 NOTE — Interval H&P Note (Signed)
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 After consideration of risks, benefits and other options for treatment, the patient has consented to  Procedure(s): ESOPHAGOGASTRODUODENOSCOPY (EGD) (N/A) as a surgical intervention .  The patient's history has been reviewed, patient examined, no change in status, stable for surgery.  I have reviewed the patient's chart and labs.  Questions were answered to the patient's satisfaction.     Ramon Zanders D

## 2013-05-31 NOTE — Progress Notes (Addendum)
Internal Medicine Attending  Date: 05/31/2013  Patient name: Micheal Daniel Medical record number: [id number] Date of birth: 1955-12-29 Age: 57 y.o. Gender: male  I was paged by Dr. Rito Ehrlich in radiology and also spoke with Dr. Denny Levy in interventional radiology about patient's CT scan.  On additional review of the CT scan, they addended the CT reading as follows:  ADDENDUM:  On additional review, one of the subcapsular metastases within the anterior segment right hepatic lobe may be hemorrhaging along the right hepatic lobe (series 7/image 30; series 11/image 27). As such, the high density material along the right liver is favored to  reflect perihepatic hemorrhage rather than peritoneal disease (series 7/image 46).   ADDENDED IMPRESSION:   Suspected active perihepatic hemorrhage of a subcapsular lesion in the anterior segment right hepatic lobe. Associated moderate perihepatic hemorrhage along the right hepatic lobe.  Given the concern about subcapsular hemorrhage, liver biopsy is not an option.  Patient is hemodynamically stable, and hemoglobin improved following transfusion.  Dr. Denny Levy did advise that if patient becomes hemodynamically unstable, his hemoglobin drops, or if there are other signs of active bleeding, they can consider an interventional procedure to embolize and stop the bleeding.  The plan is to monitor patient closely, currently in the step down unit; follow hemoglobin and transfuse as indicated; consult GI for possible EGD given concern about esophageal malignancy as primary; consult IR if patient shows signs of hemodynamic instability or active bleeding.

## 2013-05-31 NOTE — Progress Notes (Signed)
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  Pt is currently stable and asymptomatic, to begin treatment for electrolyte abnormalities.  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  Will start allopurinol 200mg  Q12H (10 mg/kg daily) and monitor.  Vernard Gambles, PharmD, BCPS 05/31/2013 3:45 AM

## 2013-05-31 NOTE — Progress Notes (Addendum)
Subjective:  Pt reports feeling better this AM.  He denies any pain.  He denies any fever/chills, CP, SOB, or N/V/D/C.  He denies any dysphagia or GERD symptoms.   Objective:  Vital signs in last 24 hours: Filed Vitals:   05/31/13 1550 05/31/13 1555 05/31/13 1614 05/31/13 1700  BP: 115/69 124/74 114/63 122/74  Pulse: 117 117 110 108  Temp:   99 F (37.2 C)   TempSrc:   Oral   Resp: 33 30 27 22   Height:      Weight:      SpO2: 98% 94% 96% 96%   Weight change:   Intake/Output Summary (Last 24 hours) at 05/31/13 1738 Last data filed at 05/31/13 1700  Gross per 24 hour  Intake   4088 ml  Output   1204 ml  Net   2884 ml    Physical Exam: Constitutional: Vital signs reviewed.  Patient is in no acute distress and cooperative with exam.   Head: Normocephalic and atraumatic Eyes: PERRL, EOMI, +scleral icterus  Neck: Supple, Trachea midline Cardiovascular: RRR, S1, S2 present, no MRG, DP 2+ b/l Pulmonary/Chest: normal respiratory effort, CTAB, no wheezes, rales, or rhonchi Abdominal: Soft. +BS, tenderness in the RUQ without guarding or rebound Neurological: A&O x3, cranial nerve II-XII are grossly intact, moving all extremities  Skin: Warm, dry and intact. No rash Psychiatric: Normal mood and affect.   Lab Results:  BMP:  Recent Labs Lab 05/30/13 0105  05/31/13 0455 05/31/13 1445  NA 133*  < > 130* 132*  K 5.9*  < > 5.1 4.4  CL 102  < > 101 102  CO2 21  < > 18* 18*  GLUCOSE 97  < > 83 74  BUN 30*  < > 30* 30*  CREATININE 1.47*  < > 1.43* 1.43*  CALCIUM 8.4  < > 8.1* 7.6*  MG 2.2  --   --   --   PHOS 5.5*  --  5.1*  --   < > = values in this interval not displayed.  CBC:  Recent Labs Lab 05/30/13 0850  05/31/13 0455 05/31/13 1445  WBC 8.7  < > 12.2* 9.5  NEUTROABS 6.5  --   --  7.1  HGB 7.3*  < > 8.2* 7.3*  HCT 20.7*  < > 23.9* 20.3*  MCV 91.6  < > 89.8 89.4  PLT 346  < > 281 279  < > = values in this interval not displayed.  Coagulation:  Recent  Labs Lab 05/30/13 1633  LABPROT 14.7  INR 1.17    LFTs:  Recent Labs Lab 05/31/13 0455 05/31/13 1445  AST 1479* 2059*  ALT 212* 299*  ALKPHOS 652* 644*  BILITOT 2.3* 2.2*  PROT 7.4 6.9  ALBUMIN 1.8* 1.8*    Pancreatic Enzymes:  Recent Labs Lab 05/30/13 0850  LIPASE 23   Cardiac Enzymes:  Recent Labs Lab 05/30/13 0105 05/31/13 0455  CKTOTAL 158 232  CKMB  --  3.9   Lab Results  Component Value Date   CKTOTAL 232 05/31/2013   CKMB 3.9 05/31/2013   TROPONINI <0.30 12/04/2012    EKG:  Date/Time:  Thursday May 30 2013 09:46:47 EST Ventricular Rate:  82 PR Interval:  138 QRS Duration: 72 QT Interval:  388 QTC Calculation: 453 R Axis:   68 Text Interpretation:  Sinus rhythm Normal ECG No significant change since last tracing Confirmed by Bebe Shaggy  MD, DONALD ([id number]) on 05/30/2013 10:02:53 AM  No results found for this  basename: DDIMER,  in the last 168 hours  Urinalysis: No results found for this basename: COLORURINE, APPERANCEUR, LABSPEC, PHURINE, GLUCOSEU, HGBUR, BILIRUBINUR, KETONESUR, PROTEINUR, UROBILINOGEN, NITRITE, LEUKOCYTESUR,  in the last 168 hours  Micro Results: Recent Results (from the past 240 hour(s))  MRSA PCR SCREENING     Status: None   Collection Time    05/30/13  5:34 PM      Result Value Range Status   MRSA by PCR NEGATIVE  NEGATIVE Final   Comment:            The GeneXpert MRSA Assay (FDA     approved for NASAL specimens     only), is one component of a     comprehensive MRSA colonization     surveillance program. It is not     intended to diagnose MRSA     infection nor to guide or     monitor treatment for     MRSA infections.    Blood Culture: No results found for this basename: sdes,  specrequest,  cult,  reptstatus    Studies/Results: Dg Chest 2 View  05/30/2013   CLINICAL DATA:  Concerning areas found within the liver.  EXAM: CHEST  2 VIEW  COMPARISON:  None.  FINDINGS: Low lung volumes. The cardiac  silhouette is in normal limits. Areas of a ill-defined nodular density project within the right mid lung, right upper lobe region, and left lung base. Further evaluation chest CT project fluid considering the liver findings is recommended. No focal regions of consolidation identified. Visualized osseous structures are unremarkable.  IMPRESSION: Multiple areas of ill-defined vague densities with a nodular appearance. Further evaluation with contrasted chest CT is recommended.   Electronically Signed   By: Salome Holmes M.D.   On: 05/30/2013 14:48   Ct Chest W Contrast  05/31/2013   ADDENDUM REPORT: 05/31/2013 14:18  ADDENDUM: On additional review, one of the subcapsular metastases within the anterior segment right hepatic lobe may be hemorrhaging along the right hepatic lobe (series 7/image 30; series 11/image 27). As such, the high density material along the right liver is favored to reflect perihepatic hemorrhage rather than peritoneal disease (series 7/image 46).  ADDENDED IMPRESSION:  Suspected active perihepatic hemorrhage of a subcapsular lesion in the anterior segment right hepatic lobe. Associated moderate perihepatic hemorrhage along the right hepatic lobe.  Mucosal irregularity/eccentric wall thickening involving the distal esophagus just above the GE junction, worrisome for primary esophageal neoplasm. Endoscopic correlation is suggested.  Innumerable hepatic metastases, as described above.  Suspected lower paraesophageal and upper abdominal nodal metastases.  Moderate abdominopelvic ascites.  These results were called by telephone at the time of interpretation on 05/31/2013 at 2:18 PM to Dr. Margarito Liner , who verbally acknowledged these results.   Electronically Signed   By: Charline Bills M.D.   On: 05/31/2013 14:18   05/31/2013   CLINICAL DATA:  Right upper quadrant pain, frequent ethanol use, concern for hepatic metastases on ultrasound  EXAM: CT CHEST, ABDOMEN, AND PELVIS WITH CONTRAST   TECHNIQUE: Multidetector CT imaging of the chest, abdomen and pelvis was performed following the standard protocol during bolus administration of intravenous contrast.  CONTRAST:  OMNIPAQUE IOHEXOL 300 MG/ML  SOLN  COMPARISON:  Ultrasound abdomen dated 05/30/2013  FINDINGS: CT CHEST FINDINGS  Mild dependent atelectasis in the bilateral lower lobes. Mild paraseptal emphysematous changes. No suspicious pulmonary nodules. Trace bilateral pleural effusions. No pneumothorax.  Visualized thyroid is unremarkable.  The heart is normal in size.  No suspicious mediastinal, hilar, or axillary lymphadenopathy. Abnormal soft tissue near the GE junction, measuring up to 2.7 x 2.1 cm (series 2/ image 39), suspicious for lower paraesophageal nodes.  Esophagus is dilated and filled with contrast. 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 Additional mucosal irregularity/eccentric wall thickening involving the distal esophagus just above the GE junction (series 2/image 39;coronal image 47), worrisome for primary esophageal neoplasm.  Degenerative changes of the thoracic spine.  CT ABDOMEN AND PELVIS FINDINGS  Mild gastric distention with contrast.  Innumerable hepatic metastases throughout both lobes. Dominant lesions include 5.2 x 4.9 cm lesion in the anterior segment right hepatic lobe (series 7/ image 21) and a 3.9 x 4.5 cm lesion in the medial segment left hepatic lobe (series 7/ image 18).  Spleen, pancreas, and adrenal glands are within normal limits.  Gallbladder is notable for suspected layering gallstone (series 7/image 39), without associated inflammatory changes. No intrahepatic or extrahepatic ductal dilatation.  Kidneys are within normal limits.  No hydronephrosis.  No evidence of bowel obstruction. No convincing colonic mass is seen.  Atherosclerotic calcifications of the abdominal aorta and branch vessels.  Moderate abdominopelvic  ascites. Associated omental caking/peritoneal disease overlying the inferior liver (series 7/ image 46).  Suspected nodal metastases in the porta hepatis measuring 2.9 and 3.7 cm short axis (series 7/ image 27).  Prostate is unremarkable.  Bladder is within normal limits.  Small right inguinal/scrotal hernia with fat/ fluid (series 7/image 85).  Mild degenerative changes of the lumbar spine. Mildly heterogeneous appearance of the bilateral iliac bones (series 7/image 60).  IMPRESSION: Mucosal irregularity/eccentric wall thickening involving the distal esophagus just above the GE junction, worrisome for primary esophageal neoplasm. Endoscopic correlation is suggested.  Innumerable hepatic metastases, as described above.  Suspected lower paraesophageal and upper abdominal nodal metastases.  Moderate abdominopelvic ascites with associated omental caking/peritoneal disease overlying the inferior liver.  Electronically Signed: By: Charline Bills M.D. On: 05/30/2013 19:17   US Abdomen Complete  05/30/2013   CLINICAL DATA:  Pain.  EXAM: ULTRASOUND ABDOMEN COMPLETE  COMPARISON:  12/04/2012 abdominal ultrasound.  FINDINGS: Gallbladder:  Again noted is sludge in the gallbladder. Gallbladder wall thickness is normal at 1.9 mm. Negative Murphy's sign. No pericholecystic fluid.  Common bile duct:  Diameter: 3.1 mm.  Liver:  Echotexture is grossly abnormal with severe heterogeneity. Probable hepatomegaly. There are multiple hepatic masses, many of which are hyperechoic. The largest on today's exam measures 3.9 cm in maximum diameter. These lesions are very suspicious for metastatic disease. Portal vein patent with normal directional flow. CT of the abdomen and pelvis suggested for further evaluation.  IVC:  No abnormality visualized.  Pancreas:  Visualized portion unremarkable.  Spleen:  Size and appearance within normal limits.  Right Kidney:  Length: 9.7 cm. Echogenicity within normal limits. No mass or hydronephrosis  visualized.  Left Kidney:  Length: 9.2 cm. Echogenicity within normal limits. No mass or hydronephrosis visualized.  Abdominal aorta:  No aneurysm visualized.  Other findings:  Complex ascites.  This suggests malignant ascites.  IMPRESSION: Innumerable hepatic masses consistent with metastatic disease. Associated ascites present. CT of the abdomen pelvis suggested for further evaluation.   Electronically Signed   By: Maisie Fus  Register   On: 05/30/2013 09:58   Ct Abdomen Pelvis W Contrast  05/31/2013   ADDENDUM REPORT: 05/31/2013 14:18  ADDENDUM: On additional review, one of the subcapsular metastases within the anterior segment right hepatic lobe  may be hemorrhaging along the right hepatic lobe (series 7/image 30; series 11/image 27). As such, the high density material along the right liver is favored to reflect perihepatic hemorrhage rather than peritoneal disease (series 7/image 46).  ADDENDED IMPRESSION:  Suspected active perihepatic hemorrhage of a subcapsular lesion in the anterior segment right hepatic lobe. Associated moderate perihepatic hemorrhage along the right hepatic lobe.  Mucosal irregularity/eccentric wall thickening involving the distal esophagus just above the GE junction, worrisome for primary esophageal neoplasm. Endoscopic correlation is suggested.  Innumerable hepatic metastases, as described above.  Suspected lower paraesophageal and upper abdominal nodal metastases.  Moderate abdominopelvic ascites.  These results were called by telephone at the time of interpretation on 05/31/2013 at 2:18 PM to Dr. Margarito Liner , who verbally acknowledged these results.   Electronically Signed   By: Charline Bills M.D.   On: 05/31/2013 14:18   05/31/2013   CLINICAL DATA:  Right upper quadrant pain, frequent ethanol use, concern for hepatic metastases on ultrasound  EXAM: CT CHEST, ABDOMEN, AND PELVIS WITH CONTRAST  TECHNIQUE: Multidetector CT imaging of the chest, abdomen and pelvis was performed  following the standard protocol during bolus administration of intravenous contrast.  CONTRAST:  OMNIPAQUE IOHEXOL 300 MG/ML  SOLN  COMPARISON:  Ultrasound abdomen dated 05/30/2013  FINDINGS: CT CHEST FINDINGS  Mild dependent atelectasis in the bilateral lower lobes. Mild paraseptal emphysematous changes. No suspicious pulmonary nodules. Trace bilateral pleural effusions. No pneumothorax.  Visualized thyroid is unremarkable.  The heart is normal in size.  No suspicious mediastinal, hilar, or axillary lymphadenopathy. Abnormal soft tissue near the GE junction, measuring up to 2.7 x 2.1 cm (series 2/ image 39), suspicious for lower paraesophageal nodes.  Esophagus is dilated and filled with contrast. 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 Additional mucosal irregularity/eccentric wall thickening involving the distal esophagus just above the GE junction (series 2/image 39;coronal image 47), worrisome for primary esophageal neoplasm.  Degenerative changes of the thoracic spine.  CT ABDOMEN AND PELVIS FINDINGS  Mild gastric distention with contrast.  Innumerable hepatic metastases throughout both lobes. Dominant lesions include 5.2 x 4.9 cm lesion in the anterior segment right hepatic lobe (series 7/ image 21) and a 3.9 x 4.5 cm lesion in the medial segment left hepatic lobe (series 7/ image 18).  Spleen, pancreas, and adrenal glands are within normal limits.  Gallbladder is notable for suspected layering gallstone (series 7/image 39), without associated inflammatory changes. No intrahepatic or extrahepatic ductal dilatation.  Kidneys are within normal limits.  No hydronephrosis.  No evidence of bowel obstruction. No convincing colonic mass is seen.  Atherosclerotic calcifications of the abdominal aorta and branch vessels.  Moderate abdominopelvic ascites. Associated omental caking/peritoneal disease overlying the inferior liver  (series 7/ image 46).  Suspected nodal metastases in the porta hepatis measuring 2.9 and 3.7 cm short axis (series 7/ image 27).  Prostate is unremarkable.  Bladder is within normal limits.  Small right inguinal/scrotal hernia with fat/ fluid (series 7/image 85).  Mild degenerative changes of the lumbar spine. Mildly heterogeneous appearance of the bilateral iliac bones (series 7/image 60).  IMPRESSION: Mucosal irregularity/eccentric wall thickening involving the distal esophagus just above the GE junction, worrisome for primary esophageal neoplasm. Endoscopic correlation is suggested.  Innumerable hepatic metastases, as described above.  Suspected lower paraesophageal and upper abdominal nodal metastases.  Moderate abdominopelvic ascites with associated omental caking/peritoneal disease overlying the inferior liver.  Electronically Signed:  By: Charline Bills M.D. On: 05/30/2013 19:17    Medications:  Scheduled Meds: . folic acid  1 mg Oral Daily  . multivitamin with minerals  1 tablet Oral Daily  . sodium chloride  3 mL Intravenous Q12H  . thiamine  100 mg Oral Daily   Or  . thiamine  100 mg Intravenous Daily   Continuous Infusions: . sodium chloride 200 mL/hr at 05/31/13 1332  . sodium chloride     PRN Meds:.sodium chloride, LORazepam, LORazepam, morphine injection, sodium chloride  Antibiotics: Anti-infectives   None     Antibiotics Given (last 72 hours)   None      Day of Hospitalization:  1 Consults: Treatment Team:  Theda Belfast, MD  Assessment/Plan: Principal Problem:   RUQ abdominal pain Active Problems:   Anemia   Mass of multiple sites of liver   Transaminitis   Protein calorie malnutrition   Elevated INR   Hyponatremia   Protein-calorie malnutrition, severe  # RUQ abdominal pain: Patient now comfortable. Liver US concerning for cholelithiasis (sludge on today's US-stones noted in June). Pt afebrile without leukocytosis or GB wall thickening, and w/o  murphy's sign and pericholecystic fluid, so less concerning for cholangitis/acute cholecystitis. Anemia and liver masses on [place] suggests malignancy ( AST>>>ALT), CT reveals thickening of the lower esophagus concerning for primary malignancy (adenocarcinoma?)  Low albumin & elevated INR suggestive of synthetic dysfunction. Lipase wnl. We [facility] to [facility] but transferred to stepdown--hgb was low at 6.4 and received 1 unit prbcs--hgb went up to 7.3.  HIV neg, Hep C +.    -keep NPO, IVF with NS 200cc/h x 10h  -Pain mgmt with morphine 2mg  q4h prn (Hold & Call MD if SBP<90, HR<65, RR<10, O2<90, or altered mental status)  -GI consulted; EGD this afternoon (Dr. Elnoria Howard) -concern for subcapsular hepatic hemorrhage; repeat CBC at 2000 -recheck CMP (significantly elevated liver enzymes)  Lab Results  Component Value Date   WBC 9.5 05/31/2013   HGB 7.3* 05/31/2013   HCT 20.3* 05/31/2013   MCV 89.4 05/31/2013   PLT 279 05/31/2013   #Normocytic Anemia: No obvious s/s of acute blood loss. Hb 14 on 12/04/12. Initial FOBT (-), no history of colonoscopy. No family h/o colorectal Ca. May be related to malignancy due to marrow infiltration, but other cell lines wnl. BR elevated, which may suggest hemolysis, but potassium wnl.  -hemodynamically stable  -repeat CBC @2000 ; transfuse if <7  #Hyponatremia: mild.  Most likely related to cirrhosis and decreased po intake or possiblebeer potomania.  No h/o heart or renal disease.  -IVF -AM labs   #EtOH Abuse: Reports 3-4 40oz beers/day  -MVI  -CIWA protocol   #Increase AG: Less likely ischemic colitis-no bloody BM, no h/o CVD. Most likely ketoacidosis, either due to EtOH or starvation  -IVF   #VTE ppx: SCDs given anemia and unclear source   #Dispo- Disposition is deferred at this time, awaiting improvement of current medical problems.  Anticipated discharge in approximately 1-2 day(s).    LOS: 1 day   Boykin Peek, MD 05/31/2013, 5:38 PM
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
## 2013-05-31 NOTE — Care Management Note (Addendum)
    Page 1 of 2   06/10/2013     11:32:58 AM   CARE MANAGEMENT NOTE 06/10/2013  Patient:  Micheal Daniel,Micheal Daniel   Account Number:  [id number]  Date Initiated:  05/31/2013  Documentation initiated by:  Junius Creamer  Subjective/Objective Assessment:   adm w abd pain     Action/Plan:   lives w friend   Anticipated DC Date:  06/07/2013   Anticipated DC Plan:  HOME/SELF CARE      DC Planning Services  CM consult      Choice offered to / List presented to:             Status of service:  In process, will continue to follow Medicare Important Message given?   (If response is "NO", the following Medicare IM given date fields will be blank) Date Medicare IM given:   Date Additional Medicare IM given:    Discharge Disposition:    Per UR Regulation:  Reviewed for med. necessity/level of care/duration of stay  If discussed at Long Length of Stay Meetings, dates discussed:   06/04/2013    Comments:  Contact:  Chapman,Betty Sister     [phone]                 Yisroel Ramming Sister [phone]                 Sharion Settler     [phone]  06-10-13 DR Cater called regarding assistance for medications for discharge to home tomorrow . MATCH letter given and explained to patient and family member .  Dr Claudell Kyle stated patient will be followed up by [facility] . Patient aware .  Spoke with Financial Counselor Rachael Darby [phone] , Ms Criss Alvine saw patient on 05-31-13 , and she will follow up to see if patient will qualifty for Medicaid .   Ronny Flurry RN BSN [phone]  06-05-13 8am  Avie Arenas, RNBSN [phone] Code status changed to DNR.  06-03-13 11:20am Avie Arenas, RNBSN [phone] Coded on 13-Jun-2023 - intubated and tx to ICU.  Discussing code status with family.

## 2013-05-31 NOTE — Progress Notes (Signed)
Internal Medicine Attending  Date: 05/31/2013  Patient name: Micheal Daniel Medical record number: [id number] Date of birth: [birth year] Age: 57 y.o. Gender: male  I saw and evaluated the patient on a.m. rounds with house staff; see the note by resident Dr. Delane Ginger for details of clinical findings and plans.  Patient reports some improvement in his right upper quadrant pain; no nausea or vomiting today.  Exam shows moderate right upper quadrant tenderness as before.  Labs are notable for sodium 130, potassium 5.1, bicarbonate 18, BUN 30, creatinine 1.43; AST today has increased to 1479, ALT is 212, alkaline phosphatase 652, total bilirubin 2.3, and albumin 1.8; WBC 12.2, hemoglobin 8.2, platelets 280.  Abdominal CT scan showed mucosal irregularity/eccentric wall thickening involving the distal esophagus just above the GE junction, worrisome for primary esophageal neoplasm; innumerable hepatic metastases; suspected lower paraesophageal and upper abdominal nodal metastases; moderate abdominopelvic ascites with associated omental caking/peritoneal disease overlying the inferior liver.  Plan is consult interventional radiology for best approach to diagnostic biopsy; supportive care and symptomatic management; oncology consult; follow metabolic panel and hemoglobin.
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
## 2013-06-01 ENCOUNTER — Inpatient Hospital Stay (HOSPITAL_COMMUNITY): Payer: Medicaid Other

## 2013-06-01 DIAGNOSIS — R578 Other shock: Secondary | ICD-10-CM

## 2013-06-01 DIAGNOSIS — E860 Dehydration: Secondary | ICD-10-CM

## 2013-06-01 DIAGNOSIS — D649 Anemia, unspecified: Secondary | ICD-10-CM

## 2013-06-01 DIAGNOSIS — K769 Liver disease, unspecified: Secondary | ICD-10-CM

## 2013-06-01 DIAGNOSIS — R791 Abnormal coagulation profile: Secondary | ICD-10-CM

## 2013-06-01 LAB — CBC WITH DIFFERENTIAL/PLATELET
Basophils Absolute: 0.1 10*3/uL (ref 0.0–0.1)
Basophils Relative: 1 % (ref 0–1)
Eosinophils Relative: 1 % (ref 0–5)
HCT: 11.9 % — ABNORMAL LOW (ref 39.0–52.0)
Hemoglobin: 3.8 g/dL — CL (ref 13.0–17.0)
Lymphocytes Relative: 18 % (ref 12–46)
Lymphs Abs: 1.6 10*3/uL (ref 0.7–4.0)
MCV: 95.2 fL (ref 78.0–100.0)
Monocytes Absolute: 1 10*3/uL (ref 0.1–1.0)
Monocytes Relative: 11 % (ref 3–12)
Neutrophils Relative %: 69 % (ref 43–77)
Platelets: 192 10*3/uL (ref 150–400)
RBC: 1.25 MIL/uL — ABNORMAL LOW (ref 4.22–5.81)
RDW: 17.9 % — ABNORMAL HIGH (ref 11.5–15.5)
WBC Morphology: INCREASED
WBC: 8.8 10*3/uL (ref 4.0–10.5)

## 2013-06-01 LAB — CBC
HCT: 20.7 % — ABNORMAL LOW (ref 39.0–52.0)
HCT: 17.8 % — ABNORMAL LOW (ref 39.0–52.0)
Hemoglobin: 5.8 g/dL — CL (ref 13.0–17.0)
Hemoglobin: 9.8 g/dL — ABNORMAL LOW (ref 13.0–17.0)
Hemoglobin: 13.4 g/dL (ref 13.0–17.0)
MCHC: 34.3 g/dL (ref 30.0–36.0)
MCHC: 32.6 g/dL (ref 30.0–36.0)
MCHC: 35.3 g/dL (ref 30.0–36.0)
MCHC: 35.4 g/dL (ref 30.0–36.0)
MCV: 91.6 fL (ref 78.0–100.0)
MCV: 96.7 fL (ref 78.0–100.0)
Platelets: 283 10*3/uL (ref 150–400)
Platelets: 149 10*3/uL — ABNORMAL LOW (ref 150–400)
Platelets: 149 10*3/uL — ABNORMAL LOW (ref 150–400)
RBC: 2.26 MIL/uL — ABNORMAL LOW (ref 4.22–5.81)
RBC: 1.84 MIL/uL — ABNORMAL LOW (ref 4.22–5.81)
RBC: 3.19 MIL/uL — ABNORMAL LOW (ref 4.22–5.81)
RDW: 17.6 % — ABNORMAL HIGH (ref 11.5–15.5)
RDW: 14.8 % (ref 11.5–15.5)
RDW: 14 % (ref 11.5–15.5)
WBC: 14.1 10*3/uL — ABNORMAL HIGH (ref 4.0–10.5)
WBC: 8.9 10*3/uL (ref 4.0–10.5)

## 2013-06-01 LAB — GLUCOSE, CAPILLARY
Glucose-Capillary: 22 mg/dL — CL (ref 70–99)
Glucose-Capillary: 145 mg/dL — ABNORMAL HIGH (ref 70–99)
Glucose-Capillary: 96 mg/dL (ref 70–99)
Glucose-Capillary: 79 mg/dL (ref 70–99)

## 2013-06-01 LAB — BASIC METABOLIC PANEL
BUN: 28 mg/dL — ABNORMAL HIGH (ref 6–23)
CO2: 11 mEq/L — ABNORMAL LOW (ref 19–32)
Calcium: 6.8 mg/dL — ABNORMAL LOW (ref 8.4–10.5)
Chloride: 111 mEq/L (ref 96–112)
Glucose, Bld: 128 mg/dL — ABNORMAL HIGH (ref 70–99)
Sodium: 135 mEq/L (ref 135–145)

## 2013-06-01 LAB — COMPREHENSIVE METABOLIC PANEL
ALT: 168 U/L — ABNORMAL HIGH (ref 0–53)
Albumin: 1.6 g/dL — ABNORMAL LOW (ref 3.5–5.2)
Alkaline Phosphatase: 377 U/L — ABNORMAL HIGH (ref 39–117)
BUN: 26 mg/dL — ABNORMAL HIGH (ref 6–23)
BUN: 26 mg/dL — ABNORMAL HIGH (ref 6–23)
CO2: 9 mEq/L — CL (ref 19–32)
Calcium: 6.5 mg/dL — ABNORMAL LOW (ref 8.4–10.5)
Creatinine, Ser: 1.15 mg/dL (ref 0.50–1.35)
GFR calc Af Amer: 62 mL/min — ABNORMAL LOW (ref >90)
GFR calc non Af Amer: 53 mL/min — ABNORMAL LOW (ref >90)
Glucose, Bld: 175 mg/dL — ABNORMAL HIGH (ref 70–99)
Potassium: 3.8 mEq/L (ref 3.5–5.1)
Potassium: 3.3 mEq/L — ABNORMAL LOW (ref 3.5–5.1)
Sodium: 138 mEq/L (ref 135–145)
Total Bilirubin: 1.7 mg/dL — ABNORMAL HIGH (ref 0.3–1.2)
Total Protein: 6.5 g/dL (ref 6.0–8.3)

## 2013-06-01 LAB — POCT I-STAT 3, ART BLOOD GAS (G3+)
O2 Saturation: 100 %
O2 Saturation: 100 %
Patient temperature: 96.1
Patient temperature: 93.7
TCO2: 10 mmol/L (ref 0–100)
TCO2: 13 mmol/L (ref 0–100)
pCO2 arterial: 18.7 mmHg — CL (ref 35.0–45.0)
pO2, Arterial: 308 mmHg — ABNORMAL HIGH (ref 80.0–100.0)

## 2013-06-01 LAB — FIBRINOGEN: Fibrinogen: 415 mg/dL (ref 204–475)

## 2013-06-01 LAB — TROPONIN I: Troponin I: <0.3 ng/mL (ref <0.30)

## 2013-06-01 MED ORDER — FENTANYL CITRATE 0.05 MG/ML IJ SOLN
INTRAMUSCULAR | Status: AC
  Administered 2013-06-01: 100 ug
  Filled 2013-06-01: qty 2

## 2013-06-01 MED ORDER — PHENYLEPHRINE HCL 10 MG/ML IJ SOLN
30.0000 ug/min | INTRAVENOUS | Status: DC
  Filled 2013-06-01: qty 1

## 2013-06-01 MED ORDER — MIDAZOLAM HCL 2 MG/2ML IJ SOLN
INTRAMUSCULAR | Status: AC
  Administered 2013-06-01: 2 mg
  Filled 2013-06-01: qty 2

## 2013-06-01 MED ORDER — MIDAZOLAM HCL 2 MG/2ML IJ SOLN
INTRAMUSCULAR | Status: AC
  Filled 2013-06-01: qty 4

## 2013-06-01 MED ORDER — FENTANYL BOLUS VIA INFUSION
100.0000 ug | Freq: Once | INTRAVENOUS | Status: DC
  Filled 2013-06-01: qty 100

## 2013-06-01 MED ORDER — ETOMIDATE 2 MG/ML IV SOLN
INTRAVENOUS | Status: AC
  Administered 2013-06-01: 10 mg
  Filled 2013-06-01: qty 10

## 2013-06-01 MED ORDER — PANTOPRAZOLE SODIUM 40 MG IV SOLR
40.0000 mg | INTRAVENOUS | Status: DC
  Administered 2013-06-01 – 2013-06-04 (×4): 40 mg via INTRAVENOUS
  Filled 2013-06-01 (×6): qty 40

## 2013-06-01 MED ORDER — DEXTROSE 5 % IV SOLN
10.0000 mg | Freq: Once | INTRAVENOUS | Status: AC
  Administered 2013-06-01: 10 mg via INTRAVENOUS
  Filled 2013-06-01: qty 1

## 2013-06-01 MED ORDER — DEXTROSE 50 % IV SOLN
INTRAVENOUS | Status: AC
  Administered 2013-06-01: 50 mL
  Filled 2013-06-01: qty 50

## 2013-06-01 MED ORDER — SODIUM CHLORIDE 0.9 % IV SOLN
20.0000 ug/h | INTRAVENOUS | Status: DC
  Administered 2013-06-01: 300 ug/h via INTRAVENOUS
  Administered 2013-06-01: 200 ug/h via INTRAVENOUS
  Filled 2013-06-01 (×3): qty 50

## 2013-06-01 MED ORDER — IOHEXOL 300 MG/ML  SOLN
150.0000 mL | Freq: Once | INTRAMUSCULAR | Status: AC | PRN
  Administered 2013-06-01: 100 mL via INTRAVENOUS

## 2013-06-01 MED ORDER — FENTANYL CITRATE 0.05 MG/ML IJ SOLN: 100.0000 ug | Freq: Once | INTRAMUSCULAR | Status: DC

## 2013-06-01 MED ORDER — MIDAZOLAM HCL 2 MG/2ML IJ SOLN: 4.0000 mg | Freq: Once | INTRAMUSCULAR | Status: DC

## 2013-06-01 MED ORDER — FENTANYL CITRATE 0.05 MG/ML IJ SOLN
INTRAMUSCULAR | Status: AC
  Filled 2013-06-01: qty 2

## 2013-06-01 NOTE — Procedures (Signed)
Proc - Hepatic parenchymal hemorrhage and embolization. Find - Active axtrav from R lobe. Embo - gelfoam and 5x2 mm coil No comp L groin sheath left in place.
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
## 2013-06-01 NOTE — Progress Notes (Signed)
Subjective:  Patient condition changed this morning and code blue was called. He was diaphoretic with increased work of breathing and a few minutes later he went into respiratory arrest. He was resuscitated for 5 minutes and intubated by critical care team. He was transferred to ICU at about 9am. Patient's sister, Theodosia Paling was contacted and updated about the change of patients clinical status.  Objective:  Vital signs in last 24 hours: Filed Vitals:   06/01/13 1141 06/01/13 1144 06/01/13 1150 06/01/13 1155  BP: 145/82 145/82  147/85  Pulse: 77 77 75 76  Temp:      TempSrc:      Resp: 35 35 35 35  Height:      Weight:      SpO2: 100% 100% 100% 100%   Weight change: 7 lb 0.9 oz (3.2 kg)  Intake/Output Summary (Last 24 hours) at 06/01/13 1227 Last data filed at 06/01/13 1104  Gross per 24 hour  Intake 4337.5 ml  Output   1001 ml  Net 3336.5 ml    Physical Exam: Vital signs were reviewed. Blood pressure was normal at 127/100. Constitutional: Vital signs reviewed.  Before coding he was in respiratory distress with increased abdominal pain. He was unable to answer questions appropriately. Eyes: PERRL, EOMI, +scleral icterus  Cardiovascular: RRR, S1, S2 present, no MRG, DP 2+ b/l Pulmonary/Chest: normal respiratory effort, CTAB, no wheezes, rales, or rhonchi Abdominal: Distended, with generalized tenderness. Unable to determine rebound or guarding tenderness. Neurological: Altered mental status around the time before his coding.  Skin: Warm, dry and intact. No rash  Lab Results:  BMP:  Recent Labs Lab 05/30/13 0105  05/31/13 0455  06/01/13 0415 06/01/13 1030  NA 133*  < > 130*  < > 135 138  K 5.9*  < > 5.1  < > 3.8 3.3*  CL 102  < > 101  < > 108 111  CO2 21  < > 18*  < > 15* 9*  GLUCOSE 97  < > 83  < > 64* 175*  BUN 30*  < > 30*  < > 26* 26*  CREATININE 1.47*  < > 1.43*  < > 1.15 1.41*  CALCIUM 8.4  < > 8.1*  < > 7.4* 6.5*  MG 2.2  --   --   --   --   --   PHOS 5.5*   --  5.1*  --   --   --   < > = values in this interval not displayed.  CBC:  Recent Labs Lab 05/31/13 1445  06/01/13 0850 06/01/13 1030  WBC 9.5  < > 14.1* 8.8  NEUTROABS 7.1  --   --  6.0  HGB 7.3*  < > 5.8* 3.8*  HCT 20.3*  < > 17.8* 11.9*  MCV 89.4  < > 96.7 95.2  PLT 279  < > 289 192  < > = values in this interval not displayed.  Coagulation:  Recent Labs Lab 05/30/13 1633 06/01/13 1026  LABPROT 14.7 24.3*  INR 1.17 2.27*    LFTs:  Recent Labs Lab 06/01/13 0415 06/01/13 1030  AST 1628* 1010*  ALT 262* 168*  ALKPHOS 582* 377*  BILITOT 2.6* 1.7*  PROT 6.5 4.3*  ALBUMIN 1.6* 1.1*    Pancreatic Enzymes:  Recent Labs Lab 05/30/13 0850  LIPASE 23   Cardiac Enzymes:  Recent Labs Lab 05/30/13 0105 05/31/13 0455  CKTOTAL 158 232  CKMB  --  3.9   Lab Results  Component  Value Date   CKTOTAL 232 05/31/2013   CKMB 3.9 05/31/2013   TROPONINI <0.30 12/04/2012    EKG:  Date/Time:  Thursday May 30 2013 09:46:47 EST Ventricular Rate:  82 PR Interval:  138 QRS Duration: 72 QT Interval:  388 QTC Calculation: 453 R Axis:   68 Text Interpretation:  Sinus rhythm Normal ECG No significant change since last tracing Confirmed by Bebe Shaggy  MD, DONALD [phone]) on 05/30/2013 10:02:53 AM  No results found for this basename: DDIMER,  in the last 168 hours  Urinalysis: No results found for this basename: COLORURINE, APPERANCEUR, LABSPEC, PHURINE, GLUCOSEU, HGBUR, BILIRUBINUR, KETONESUR, PROTEINUR, UROBILINOGEN, NITRITE, LEUKOCYTESUR,  in the last 168 hours  Micro Results: Recent Results (from the past 240 hour(s))  MRSA PCR SCREENING     Status: None   Collection Time    05/30/13  5:34 PM      Result Value Range Status   MRSA by PCR NEGATIVE  NEGATIVE Final   Comment:            The GeneXpert MRSA Assay (FDA     approved for NASAL specimens     only), is one component of a     comprehensive MRSA colonization     surveillance program. It is not      intended to diagnose MRSA     infection nor to guide or     monitor treatment for     MRSA infections.    Blood Culture: No results found for this basename: sdes,  specrequest,  cult,  reptstatus    Studies/Results: Dg Chest 2 View  05/30/2013   CLINICAL DATA:  Concerning areas found within the liver.  EXAM: CHEST  2 VIEW  COMPARISON:  None.  FINDINGS: Low lung volumes. The cardiac silhouette is in normal limits. Areas of a ill-defined nodular density project within the right mid lung, right upper lobe region, and left lung base. Further evaluation chest CT project fluid considering the liver findings is recommended. No focal regions of consolidation identified. Visualized osseous structures are unremarkable.  IMPRESSION: Multiple areas of ill-defined vague densities with a nodular appearance. Further evaluation with contrasted chest CT is recommended.   Electronically Signed   By: Salome Holmes M.D.   On: 05/30/2013 14:48   Ct Chest W Contrast  05/31/2013   ADDENDUM REPORT: 05/31/2013 14:18  ADDENDUM: On additional review, one of the subcapsular metastases within the anterior segment right hepatic lobe may be hemorrhaging along the right hepatic lobe (series 7/image 30; series 11/image 27). As such, the high density material along the right liver is favored to reflect perihepatic hemorrhage rather than peritoneal disease (series 7/image 46).  ADDENDED IMPRESSION:  Suspected active perihepatic hemorrhage of a subcapsular lesion in the anterior segment right hepatic lobe. Associated moderate perihepatic hemorrhage along the right hepatic lobe.  Mucosal irregularity/eccentric wall thickening involving the distal esophagus just above the GE junction, worrisome for primary esophageal neoplasm. Endoscopic correlation is suggested.  Innumerable hepatic metastases, as described above.  Suspected lower paraesophageal and upper abdominal nodal metastases.  Moderate abdominopelvic ascites.  These results  were called by telephone at the time of interpretation on 05/31/2013 at 2:18 PM to Dr. Margarito Liner , who verbally acknowledged these results.   Electronically Signed   By: Charline Bills M.D.   On: 05/31/2013 14:18   05/31/2013   CLINICAL DATA:  Right upper quadrant pain, frequent ethanol use, concern for hepatic metastases on ultrasound  EXAM: CT CHEST, ABDOMEN, AND  PELVIS WITH CONTRAST  TECHNIQUE: Multidetector CT imaging of the chest, abdomen and pelvis was performed following the standard protocol during bolus administration of intravenous contrast.  CONTRAST:  OMNIPAQUE IOHEXOL 300 MG/ML  SOLN  COMPARISON:  Ultrasound abdomen dated 05/30/2013  FINDINGS: CT CHEST FINDINGS  Mild dependent atelectasis in the bilateral lower lobes. Mild paraseptal emphysematous changes. No suspicious pulmonary nodules. Trace bilateral pleural effusions. No pneumothorax.  Visualized thyroid is unremarkable.  The heart is normal in size.  No suspicious mediastinal, hilar, or axillary lymphadenopathy. Abnormal soft tissue near the GE junction, measuring up to 2.7 x 2.1 cm (series 2/ image 39), suspicious for lower paraesophageal nodes.  Esophagus is dilated and filled with contrast. 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 Additional mucosal irregularity/eccentric wall thickening involving the distal esophagus just above the GE junction (series 2/image 39;coronal image 47), worrisome for primary esophageal neoplasm.  Degenerative changes of the thoracic spine.  CT ABDOMEN AND PELVIS FINDINGS  Mild gastric distention with contrast.  Innumerable hepatic metastases throughout both lobes. Dominant lesions include 5.2 x 4.9 cm lesion in the anterior segment right hepatic lobe (series 7/ image 21) and a 3.9 x 4.5 cm lesion in the medial segment left hepatic lobe (series 7/ image 18).  Spleen, pancreas, and adrenal glands are within normal limits.   Gallbladder is notable for suspected layering gallstone (series 7/image 39), without associated inflammatory changes. No intrahepatic or extrahepatic ductal dilatation.  Kidneys are within normal limits.  No hydronephrosis.  No evidence of bowel obstruction. No convincing colonic mass is seen.  Atherosclerotic calcifications of the abdominal aorta and branch vessels.  Moderate abdominopelvic ascites. Associated omental caking/peritoneal disease overlying the inferior liver (series 7/ image 46).  Suspected nodal metastases in the porta hepatis measuring 2.9 and 3.7 cm short axis (series 7/ image 27).  Prostate is unremarkable.  Bladder is within normal limits.  Small right inguinal/scrotal hernia with fat/ fluid (series 7/image 85).  Mild degenerative changes of the lumbar spine. Mildly heterogeneous appearance of the bilateral iliac bones (series 7/image 60).  IMPRESSION: Mucosal irregularity/eccentric wall thickening involving the distal esophagus just above the GE junction, worrisome for primary esophageal neoplasm. Endoscopic correlation is suggested.  Innumerable hepatic metastases, as described above.  Suspected lower paraesophageal and upper abdominal nodal metastases.  Moderate abdominopelvic ascites with associated omental caking/peritoneal disease overlying the inferior liver.  Electronically Signed: By: Charline Bills M.D. On: 05/30/2013 19:17   Ct Abdomen Pelvis W Contrast  05/31/2013   ADDENDUM REPORT: 05/31/2013 14:18  ADDENDUM: On additional review, one of the subcapsular metastases within the anterior segment right hepatic lobe may be hemorrhaging along the right hepatic lobe (series 7/image 30; series 11/image 27). As such, the high density material along the right liver is favored to reflect perihepatic hemorrhage rather than peritoneal disease (series 7/image 46).  ADDENDED IMPRESSION:  Suspected active perihepatic hemorrhage of a subcapsular lesion in the anterior segment right hepatic  lobe. Associated moderate perihepatic hemorrhage along the right hepatic lobe.  Mucosal irregularity/eccentric wall thickening involving the distal esophagus just above the GE junction, worrisome for primary esophageal neoplasm. Endoscopic correlation is suggested.  Innumerable hepatic metastases, as described above.  Suspected lower paraesophageal and upper abdominal nodal metastases.  Moderate abdominopelvic ascites.  These results were called by telephone at the time of interpretation on 05/31/2013 at 2:18 PM to Dr. Margarito Liner , who verbally acknowledged these results.   Electronically  Signed   By: Charline Bills M.D.   On: 05/31/2013 14:18   05/31/2013   CLINICAL DATA:  Right upper quadrant pain, frequent ethanol use, concern for hepatic metastases on ultrasound  EXAM: CT CHEST, ABDOMEN, AND PELVIS WITH CONTRAST  TECHNIQUE: Multidetector CT imaging of the chest, abdomen and pelvis was performed following the standard protocol during bolus administration of intravenous contrast.  CONTRAST:  OMNIPAQUE IOHEXOL 300 MG/ML  SOLN  COMPARISON:  Ultrasound abdomen dated 05/30/2013  FINDINGS: CT CHEST FINDINGS  Mild dependent atelectasis in the bilateral lower lobes. Mild paraseptal emphysematous changes. No suspicious pulmonary nodules. Trace bilateral pleural effusions. No pneumothorax.  Visualized thyroid is unremarkable.  The heart is normal in size.  No suspicious mediastinal, hilar, or axillary lymphadenopathy. Abnormal soft tissue near the GE junction, measuring up to 2.7 x 2.1 cm (series 2/ image 39), suspicious for lower paraesophageal nodes.  Esophagus is dilated and filled with contrast. 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 Additional mucosal irregularity/eccentric wall thickening involving the distal esophagus just above the GE junction (series 2/image 39;coronal image 47), worrisome for primary esophageal  neoplasm.  Degenerative changes of the thoracic spine.  CT ABDOMEN AND PELVIS FINDINGS  Mild gastric distention with contrast.  Innumerable hepatic metastases throughout both lobes. Dominant lesions include 5.2 x 4.9 cm lesion in the anterior segment right hepatic lobe (series 7/ image 21) and a 3.9 x 4.5 cm lesion in the medial segment left hepatic lobe (series 7/ image 18).  Spleen, pancreas, and adrenal glands are within normal limits.  Gallbladder is notable for suspected layering gallstone (series 7/image 39), without associated inflammatory changes. No intrahepatic or extrahepatic ductal dilatation.  Kidneys are within normal limits.  No hydronephrosis.  No evidence of bowel obstruction. No convincing colonic mass is seen.  Atherosclerotic calcifications of the abdominal aorta and branch vessels.  Moderate abdominopelvic ascites. Associated omental caking/peritoneal disease overlying the inferior liver (series 7/ image 46).  Suspected nodal metastases in the porta hepatis measuring 2.9 and 3.7 cm short axis (series 7/ image 27).  Prostate is unremarkable.  Bladder is within normal limits.  Small right inguinal/scrotal hernia with fat/ fluid (series 7/image 85).  Mild degenerative changes of the lumbar spine. Mildly heterogeneous appearance of the bilateral iliac bones (series 7/image 60).  IMPRESSION: Mucosal irregularity/eccentric wall thickening involving the distal esophagus just above the GE junction, worrisome for primary esophageal neoplasm. Endoscopic correlation is suggested.  Innumerable hepatic metastases, as described above.  Suspected lower paraesophageal and upper abdominal nodal metastases.  Moderate abdominopelvic ascites with associated omental caking/peritoneal disease overlying the inferior liver.  Electronically Signed: By: Charline Bills M.D. On: 05/30/2013 19:17   Dg Chest Port 1 View  06/01/2013   CLINICAL DATA:  Evaluate endotracheal tube placement and central line placement.   EXAM: PORTABLE CHEST - 1 VIEW  COMPARISON:  05/30/2013.  FINDINGS: Defibrillator pads overlie the chest. Endotracheal tube is present with the tip 3 cm from the carina. Right IJ vascular sheath is present. There is no pneumothorax. Left basilar atelectasis. Low volumes accentuate the size of the cardiopericardial silhouette and produce tortuosity of the thoracic aorta. Enteric contrast is present in the left upper quadrant.  IMPRESSION: 1. Endotracheal tube and uncomplicated right IJ vascular sheath. Endotracheal tube tip 3 cm from the carina. 2. Left basilar atelectasis.   Electronically Signed   By: Andreas Newport M.D.   On: 06/01/2013 09:43  Medications:  Scheduled Meds: . fentaNYL  100 mcg Intravenous Once  . folic acid  1 mg Oral Daily  . midazolam  4 mg Intravenous Once  . multivitamin with minerals  1 tablet Oral Daily  . pantoprazole (PROTONIX) IV  40 mg Intravenous Q24H  . sodium chloride  3 mL Intravenous Q12H  . thiamine  100 mg Oral Daily   Or  . thiamine  100 mg Intravenous Daily   Continuous Infusions: . sodium chloride 200 mL/hr at 06/01/13 0623  . sodium chloride    . fentaNYL infusion INTRAVENOUS    . phenylephrine (NEO-SYNEPHRINE) Adult infusion     PRN Meds:.sodium chloride, sodium chloride  Antibiotics: Anti-infectives   None     Antibiotics Given (last 72 hours)   None      Day of Hospitalization:  2 Consults: Treatment Team:  Theda Belfast, MD Beverley Fiedler, MD  Assessment/Plan: Principal Problem:   RUQ abdominal pain Active Problems:   Anemia   Mass of multiple sites of liver   Transaminitis   Protein calorie malnutrition   Elevated INR   Hyponatremia   Protein-calorie malnutrition, severe  # Presumed intraperitoneal bleeding: Patient went into respiratory arrest on the morning of 06/01/2013. Most likely resulting from subcapsular liver bleeding from liver masses with massive hemorrhage. Patient was quickly resuscitated, intubated, and  transferred to the ICU. Plan will be to involve interventional radiology for embolization. Bedside abdominal ultrasound scan, stat CBC and BMP ordered. ABGs also ordered.   # Liver masses, likely malignant Patient went into respiratory arrest. Anemia was noted on admission and liver masses on [place] suggests malignancy ( AST>>>ALT), CT reveals thickening of the lower esophagus concerning for primary malignancy (adenocarcinoma?)  Low albumin & elevated INR suggestive of synthetic dysfunction. Lipase wnl. Hgb was low at 6.4 and received 1 unit prbcs--hgb went up to 7.3.  HIV neg, Hep C +.    Plan. -Patient is intubated and transferred to the ICU. -Will assume care once patient is stable and transferred to IMTS.  - Will require tissue biopsy when possible. )  Lab Results  Component Value Date   WBC 8.8 06/01/2013   HGB 3.8* 06/01/2013   HCT 11.9* 06/01/2013   MCV 95.2 06/01/2013   PLT 192 06/01/2013   #Normocytic Anemia: , likely from intraperitoneal bleeding as noted on CT scan with concern of some subcapsular liver bleeding. No obvious s/s of acute blood loss. Hb 14 on 12/04/12. Initial FOBT (-), no history of colonoscopy. No family h/o colorectal Ca. Patient transferred to ICU with upper arm to involve INR for consideration of embolization.   #Hyponatremia:  Resolved.  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 Most likely ketoacidosis, either due to EtOH or starvation  -IVF   #VTE ppx: SCDs given anemia and unclear source   #Dispo- Disposition is deferred at this time. Patient transferred to ICU for further care.      LOS: 2 days   Dow Adolph, MD 06/01/2013, 12:27 PM

## 2013-06-01 NOTE — Significant Event (Signed)
Rapid Response Event Note  Overview: Time Called: 0900 Event Type: Other (Comment)  Initial Focused Assessment:  Code Blue called.  Upon arrival to patients room, ACLS protocol underway.  SEE code BLue sheet.    Interventions:  See code Blue sheet   Event Summary: patient transported to 2M10 via bed, with zoll monitor and vent.  Report given to RN by primary RN   at      at          [facility]

## 2013-06-01 NOTE — ED Notes (Signed)
Pt remains sedated on vent.

## 2013-06-01 NOTE — Procedures (Signed)
Intubation Procedure Note Yassir Enis [id number] [birth year]  Procedure: Intubation Indications: Respiratory insufficiency  Procedure Details Consent: Unable to obtain consent because of emergent medical necessity. Time Out: Verified patient identification, verified procedure, site/side was marked, verified correct patient position, special equipment/implants available, medications/allergies/relevent history reviewed, required imaging and test results available.  Performed  Maximum sterile technique was used including gown, hand hygiene and mask.  MAC and 4    Evaluation Hemodynamic Status: coding; O2 sats: coding Patient's Current Condition: unstable Complications: No apparent complications Patient did tolerate procedure well. Chest X-ray ordered to verify placement.  CXR: pending.   [name] 06/01/2013

## 2013-06-01 NOTE — Procedures (Signed)
CPR Called for Vt arrest ACLS followed Epi,  ett placed k was wnl in am  likely needs prbc To icu acls followed in to ST [address] Awaken post cpr Code 4 min   Mcarthur Rossetti. Tyson Alias, MD, FACP Pgr: [phone] [facility]

## 2013-06-01 NOTE — Progress Notes (Signed)
Progress Note   Subjective  VT arrest earlier this morning, now intubated Concern is for hemorrhagic shock, no hematemesis, melena or sign of luminal GI bleeding Transferred to ICU, IR has been consulted for emergent angio with possible embolization   Objective  Vital signs in last 24 hours: Temp:  [96.1 F (35.6 C)-99.7 F (37.6 C)] 96.1 F (35.6 C) (12/20 0923) Pulse Rate:  [29-118] 29 (12/20 0850) Resp:  [19-41] 21 (12/20 0904) BP: (111-153)/(63-98) 127/71 mmHg (12/20 0904) SpO2:  [60 %-100 %] 60 % (12/20 0850) FiO2 (%):  [100 %] 100 % (12/20 0830) Weight:  [110 lb 3.7 oz (50 kg)] 110 lb 3.7 oz (50 kg) (12/20 0452) Last BM Date: 05/31/13 Gen: intubated, sedated HEENT: ETT in place CV: tachy, regular Pulm: course b/l  Abd: firm and distended, hypoactive bs Ext: no c/c/e Neuro: sedated   Intake/Output from previous day: 12/19 0701 - 12/20 0700 In: 4600 [I.V.:4600] Out: 1204 [Urine:1200; Stool:4] Intake/Output this shift:    Lab Results:  Recent Labs  05/31/13 2025 06/01/13 0415 06/01/13 0850  WBC 10.4 10.1 14.1*  HGB 7.3* 7.1* 5.8*  HCT 20.8* 20.7* 17.8*  PLT 288 283 289   BMET  Recent Labs  05/31/13 0455 05/31/13 1445 06/01/13 0415  NA 130* 132* 135  K 5.1 4.4 3.8  CL 101 102 108  CO2 18* 18* 15*  GLUCOSE 83 74 64*  BUN 30* 30* 26*  CREATININE 1.43* 1.43* 1.15  CALCIUM 8.1* 7.6* 7.4*   LFT  Recent Labs  06/01/13 0415  PROT 6.5  ALBUMIN 1.6*  AST 1628*  ALT 262*  ALKPHOS 582*  BILITOT 2.6*   PT/INR  Recent Labs  05/30/13 1633  LABPROT 14.7  INR 1.17   Hepatitis Panel  Recent Labs  05/30/13 1350  HEPBSAG NEGATIVE  HCVAB Reactive*  HEPAIGM NON REACTIVE  HEPBIGM NON REACTIVE    Studies/Results: Dg Chest 2 View  05/30/2013   CLINICAL DATA:  Concerning areas found within the liver.  EXAM: CHEST  2 VIEW  COMPARISON:  None.  FINDINGS: Low lung volumes. The cardiac silhouette is in normal limits. Areas of a ill-defined  nodular density project within the right mid lung, right upper lobe region, and left lung base. Further evaluation chest CT project fluid considering the liver findings is recommended. No focal regions of consolidation identified. Visualized osseous structures are unremarkable.  IMPRESSION: Multiple areas of ill-defined vague densities with a nodular appearance. Further evaluation with contrasted chest CT is recommended.   Electronically Signed   By: Salome Holmes Daniel.D.   On: 05/30/2013 14:48   Ct Chest W Contrast  05/31/2013   ADDENDUM REPORT: 05/31/2013 14:18  ADDENDUM: On additional review, one of the subcapsular metastases within the anterior segment right hepatic lobe may be hemorrhaging along the right hepatic lobe (series 7/image 30; series 11/image 27). As such, the high density material along the right liver is favored to reflect perihepatic hemorrhage rather than peritoneal disease (series 7/image 46).  ADDENDED IMPRESSION:  Suspected active perihepatic hemorrhage of a subcapsular lesion in the anterior segment right hepatic lobe. Associated moderate perihepatic hemorrhage along the right hepatic lobe.  Mucosal irregularity/eccentric wall thickening involving the distal esophagus just above the GE junction, worrisome for primary esophageal neoplasm. Endoscopic correlation is suggested.  Innumerable hepatic metastases, as described above.  Suspected lower paraesophageal and upper abdominal nodal metastases.  Moderate abdominopelvic ascites.  These results were called by telephone at the time of interpretation on 05/31/2013  at 2:18 PM to Dr. Margarito Liner , who verbally acknowledged these results.   Electronically Signed   By: Charline Bills Daniel.D.   On: 05/31/2013 14:18   05/31/2013   CLINICAL DATA:  Right upper quadrant pain, frequent ethanol use, concern for hepatic metastases on ultrasound  EXAM: CT CHEST, ABDOMEN, AND PELVIS WITH CONTRAST  TECHNIQUE: Multidetector CT imaging of the chest, abdomen  and pelvis was performed following the standard protocol during bolus administration of intravenous contrast.  CONTRAST:  OMNIPAQUE IOHEXOL 300 MG/ML  SOLN  COMPARISON:  Ultrasound abdomen dated 05/30/2013  FINDINGS: CT CHEST FINDINGS  Mild dependent atelectasis in the bilateral lower lobes. Mild paraseptal emphysematous changes. No suspicious pulmonary nodules. Trace bilateral pleural effusions. No pneumothorax.  Visualized thyroid is unremarkable.  The heart is normal in size.  No suspicious mediastinal, hilar, or axillary lymphadenopathy. Abnormal soft tissue near the GE junction, measuring up to 2.7 x 2.1 cm (series 2/ image 39), suspicious for lower paraesophageal nodes.  Esophagus is dilated and filled with contrast. 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 Additional mucosal irregularity/eccentric wall thickening involving the distal esophagus just above the GE junction (series 2/image 39;coronal image 47), worrisome for primary esophageal neoplasm.  Degenerative changes of the thoracic spine.  CT ABDOMEN AND PELVIS FINDINGS  Mild gastric distention with contrast.  Innumerable hepatic metastases throughout both lobes. Dominant lesions include 5.2 x 4.9 cm lesion in the anterior segment right hepatic lobe (series 7/ image 21) and a 3.9 x 4.5 cm lesion in the medial segment left hepatic lobe (series 7/ image 18).  Spleen, pancreas, and adrenal glands are within normal limits.  Gallbladder is notable for suspected layering gallstone (series 7/image 39), without associated inflammatory changes. No intrahepatic or extrahepatic ductal dilatation.  Kidneys are within normal limits.  No hydronephrosis.  No evidence of bowel obstruction. No convincing colonic mass is seen.  Atherosclerotic calcifications of the abdominal aorta and branch vessels.  Moderate abdominopelvic ascites. Associated omental caking/peritoneal disease  overlying the inferior liver (series 7/ image 46).  Suspected nodal metastases in the porta hepatis measuring 2.9 and 3.7 cm short axis (series 7/ image 27).  Prostate is unremarkable.  Bladder is within normal limits.  Small right inguinal/scrotal hernia with fat/ fluid (series 7/image 85).  Mild degenerative changes of the lumbar spine. Mildly heterogeneous appearance of the bilateral iliac bones (series 7/image 60).  IMPRESSION: Mucosal irregularity/eccentric wall thickening involving the distal esophagus just above the GE junction, worrisome for primary esophageal neoplasm. Endoscopic correlation is suggested.  Innumerable hepatic metastases, as described above.  Suspected lower paraesophageal and upper abdominal nodal metastases.  Moderate abdominopelvic ascites with associated omental caking/peritoneal disease overlying the inferior liver.  Electronically Signed: By: Charline Bills Daniel.D. On: 05/30/2013 19:17   Ct Abdomen Pelvis W Contrast  05/31/2013   ADDENDUM REPORT: 05/31/2013 14:18  ADDENDUM: On additional review, one of the subcapsular metastases within the anterior segment right hepatic lobe may be hemorrhaging along the right hepatic lobe (series 7/image 30; series 11/image 27). As such, the high density material along the right liver is favored to reflect perihepatic hemorrhage rather than peritoneal disease (series 7/image 46).  ADDENDED IMPRESSION:  Suspected active perihepatic hemorrhage of a subcapsular lesion in the anterior segment right hepatic lobe. Associated moderate perihepatic hemorrhage along the right hepatic lobe.  Mucosal irregularity/eccentric wall thickening involving the distal esophagus just above the GE junction, worrisome for primary  esophageal neoplasm. Endoscopic correlation is suggested.  Innumerable hepatic metastases, as described above.  Suspected lower paraesophageal and upper abdominal nodal metastases.  Moderate abdominopelvic ascites.  These results were called by  telephone at the time of interpretation on 05/31/2013 at 2:18 PM to Dr. Margarito Liner , who verbally acknowledged these results.   Electronically Signed   By: Charline Bills Daniel.D.   On: 05/31/2013 14:18   05/31/2013   CLINICAL DATA:  Right upper quadrant pain, frequent ethanol use, concern for hepatic metastases on ultrasound  EXAM: CT CHEST, ABDOMEN, AND PELVIS WITH CONTRAST  TECHNIQUE: Multidetector CT imaging of the chest, abdomen and pelvis was performed following the standard protocol during bolus administration of intravenous contrast.  CONTRAST:  OMNIPAQUE IOHEXOL 300 MG/ML  SOLN  COMPARISON:  Ultrasound abdomen dated 05/30/2013  FINDINGS: CT CHEST FINDINGS  Mild dependent atelectasis in the bilateral lower lobes. Mild paraseptal emphysematous changes. No suspicious pulmonary nodules. Trace bilateral pleural effusions. No pneumothorax.  Visualized thyroid is unremarkable.  The heart is normal in size.  No suspicious mediastinal, hilar, or axillary lymphadenopathy. Abnormal soft tissue near the GE junction, measuring up to 2.7 x 2.1 cm (series 2/ image 39), suspicious for lower paraesophageal nodes.  Esophagus is dilated and filled with contrast. 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 Additional mucosal irregularity/eccentric wall thickening involving the distal esophagus just above the GE junction (series 2/image 39;coronal image 47), worrisome for primary esophageal neoplasm.  Degenerative changes of the thoracic spine.  CT ABDOMEN AND PELVIS FINDINGS  Mild gastric distention with contrast.  Innumerable hepatic metastases throughout both lobes. Dominant lesions include 5.2 x 4.9 cm lesion in the anterior segment right hepatic lobe (series 7/ image 21) and a 3.9 x 4.5 cm lesion in the medial segment left hepatic lobe (series 7/ image 18).  Spleen, pancreas, and adrenal glands are within normal limits.  Gallbladder is  notable for suspected layering gallstone (series 7/image 39), without associated inflammatory changes. No intrahepatic or extrahepatic ductal dilatation.  Kidneys are within normal limits.  No hydronephrosis.  No evidence of bowel obstruction. No convincing colonic mass is seen.  Atherosclerotic calcifications of the abdominal aorta and branch vessels.  Moderate abdominopelvic ascites. Associated omental caking/peritoneal disease overlying the inferior liver (series 7/ image 46).  Suspected nodal metastases in the porta hepatis measuring 2.9 and 3.7 cm short axis (series 7/ image 27).  Prostate is unremarkable.  Bladder is within normal limits.  Small right inguinal/scrotal hernia with fat/ fluid (series 7/image 85).  Mild degenerative changes of the lumbar spine. Mildly heterogeneous appearance of the bilateral iliac bones (series 7/image 60).  IMPRESSION: Mucosal irregularity/eccentric wall thickening involving the distal esophagus just above the GE junction, worrisome for primary esophageal neoplasm. Endoscopic correlation is suggested.  Innumerable hepatic metastases, as described above.  Suspected lower paraesophageal and upper abdominal nodal metastases.  Moderate abdominopelvic ascites with associated omental caking/peritoneal disease overlying the inferior liver.  Electronically Signed: By: Charline Bills Daniel.D. On: 05/30/2013 19:17   Dg Chest Port 1 View  06/01/2013   CLINICAL DATA:  Evaluate endotracheal tube placement and central line placement.  EXAM: PORTABLE CHEST - 1 VIEW  COMPARISON:  05/30/2013.  FINDINGS: Defibrillator pads overlie the chest. Endotracheal tube is present with the tip 3 cm from the carina. Right IJ vascular sheath is present. There is no pneumothorax. Left basilar atelectasis. Low volumes accentuate the size of the cardiopericardial silhouette  and produce tortuosity of the thoracic aorta. Enteric contrast is present in the left upper quadrant.  IMPRESSION: 1. Endotracheal tube  and uncomplicated right IJ vascular sheath. Endotracheal tube tip 3 cm from the carina. 2. Left basilar atelectasis.   Electronically Signed   By: Andreas Newport Daniel.D.   On: 06/01/2013 09:43      Assessment & Plan  57 year old male with history of gallstones who is admitted with acute right upper quadrant pain. CT of the chest abdomen and pelvis showed multiple hepatic metastasis with concern for perihepatic hemorrhage, omental caking, and question of distal esophageal thickening.  1.  VT arrest/hemorrhagic shock -- code blue this morning felt secondary to hypovolemia associated with hemorrhage.  Now intubated and sedated.  Liver is felt most likely source of hemorrhage.  No evidence of hemorrhage in the the luminal GI tract at this time.  Agree with IR consultation for attempt at hepatic embolization of known tumor.  Prognosis is poor. --No role of endoscopy at present --IR for attempt at hepatic embolization (the most likely source of acute hemorrhage) --PRBC transfusion  2.  Metastatic cancer of unknown primary -- no evidence for esophageal cancer on EGD performed by Dr. Elnoria Howard yesterday.  At present, the source of the cancer is not important given hemorrhagic shock.  Further evaluation to this regard can be considered once the patient stabilizes     Principal Problem:   RUQ abdominal pain Active Problems:   Anemia   Mass of multiple sites of liver   Transaminitis   Protein calorie malnutrition   Elevated INR   Hyponatremia   Protein-calorie malnutrition, severe     LOS: 2 days   Micheal Daniel  06/01/2013, 10:01 AM

## 2013-06-01 NOTE — ED Notes (Signed)
Pt arrived on vent with RT running fentanyl gtt and blood.  Pt arousable given fent bolus per pump for pain and sedation

## 2013-06-01 NOTE — Procedures (Signed)
Arterial Catheter Insertion Procedure Note Jayd Cadieux [id number] [birth year]  Procedure: Insertion of Arterial Catheter  Indications: Blood pressure monitoring and Frequent blood sampling  Procedure Details Consent: Unable to obtain consent because of emergent medical necessity. Time Out: Verified patient identification, verified procedure, site/side was marked, verified correct patient position, special equipment/implants available, medications/allergies/relevent history reviewed, required imaging and test results available.  Performed  Maximum sterile technique was used including antiseptics, cap, gloves, gown, hand hygiene, mask and sheet. Skin prep: Chlorhexidine; local anesthetic administered 20 gauge catheter was inserted into right femoral artery using the Seldinger technique. Near code post arrest resusc Evaluation Blood flow good; BP tracing good. Complications: No apparent complications.   [name] 06/01/2013

## 2013-06-01 NOTE — Progress Notes (Signed)
I contacted patient's sister Esperanza Sheets about the patient's change of clinical status, resuscitation, intubation and transfer to the ICU. She will be coming over shortly.
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
## 2013-06-01 NOTE — Progress Notes (Signed)
Chaplain paged to [employer]. Chaplain arrived and found staff working on the patient. No family members were present. Patient revived and alert.Chaplain will follow up if needed or requested.   06/01/13 0839  Clinical Encounter Type  Visited With Patient not available;Health care provider  Visit Type Initial;Code

## 2013-06-01 NOTE — Procedures (Signed)
Central Venous Catheter Insertion Procedure Note Micheal Daniel [id number] [birth year]  Procedure: Insertion of Central Venous Catheter Indications: Drug and/or fluid administration  Procedure Details Consent: Unable to obtain consent because of emergent medical necessity. Time Out: Verified patient identification, verified procedure, site/side was marked, verified correct patient position, special equipment/implants available, medications/allergies/relevent history reviewed, required imaging and test results available.  Performed  Maximum sterile technique was used including antiseptics, cap, gloves, gown, hand hygiene, mask and sheet. Skin prep: Chlorhexidine; local anesthetic administered A antimicrobial bonded/coated single lumen cordis for rapid Tx  catheter was placed in the right internal jugular vein using the Seldinger technique.  Evaluation Blood flow good Complications: No apparent complications Patient did tolerate procedure well. Chest X-ray ordered to verify placement.  CXR: pending.  Micheal Daniel 06/01/2013, 9:34 AM  [place] gudiance hemmorhagic shock  Micheal Daniel. Micheal Alias, MD, FACP Pgr: [phone] [facility]

## 2013-06-01 NOTE — ED Notes (Signed)
Pt given 50 mcg bolus fentanyl per gtt.
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
## 2013-06-02 ENCOUNTER — Inpatient Hospital Stay (HOSPITAL_COMMUNITY): Payer: Medicaid Other

## 2013-06-02 LAB — GLUCOSE, CAPILLARY
Glucose-Capillary: 117 mg/dL — ABNORMAL HIGH (ref 70–99)
Glucose-Capillary: 66 mg/dL — ABNORMAL LOW (ref 70–99)
Glucose-Capillary: 104 mg/dL — ABNORMAL HIGH (ref 70–99)
Glucose-Capillary: 58 mg/dL — ABNORMAL LOW (ref 70–99)
Glucose-Capillary: 50 mg/dL — ABNORMAL LOW (ref 70–99)
Glucose-Capillary: 153 mg/dL — ABNORMAL HIGH (ref 70–99)
Glucose-Capillary: 52 mg/dL — ABNORMAL LOW (ref 70–99)
Glucose-Capillary: 56 mg/dL — ABNORMAL LOW (ref 70–99)
Glucose-Capillary: 60 mg/dL — ABNORMAL LOW (ref 70–99)
Glucose-Capillary: 125 mg/dL — ABNORMAL HIGH (ref 70–99)
Glucose-Capillary: 88 mg/dL (ref 70–99)

## 2013-06-02 LAB — COMPREHENSIVE METABOLIC PANEL
Albumin: 2.1 g/dL — ABNORMAL LOW (ref 3.5–5.2)
Alkaline Phosphatase: 366 U/L — ABNORMAL HIGH (ref 39–117)
BUN: 26 mg/dL — ABNORMAL HIGH (ref 6–23)
CO2: 13 mEq/L — ABNORMAL LOW (ref 19–32)
Chloride: 111 mEq/L (ref 96–112)
Creatinine, Ser: 1.26 mg/dL (ref 0.50–1.35)
GFR calc Af Amer: 71 mL/min — ABNORMAL LOW (ref >90)
Glucose, Bld: 211 mg/dL — ABNORMAL HIGH (ref 70–99)
Potassium: 3.5 mEq/L (ref 3.5–5.1)
Total Bilirubin: 3.7 mg/dL — ABNORMAL HIGH (ref 0.3–1.2)
Total Protein: 6.2 g/dL (ref 6.0–8.3)

## 2013-06-02 LAB — CBC
HCT: 30.7 % — ABNORMAL LOW (ref 39.0–52.0)
HCT: 27.2 % — ABNORMAL LOW (ref 39.0–52.0)
HCT: 28.7 % — ABNORMAL LOW (ref 39.0–52.0)
HCT: 28.7 % — ABNORMAL LOW (ref 39.0–52.0)
Hemoglobin: 11.2 g/dL — ABNORMAL LOW (ref 13.0–17.0)
Hemoglobin: 10.9 g/dL — ABNORMAL LOW (ref 13.0–17.0)
Hemoglobin: 10 g/dL — ABNORMAL LOW (ref 13.0–17.0)
Hemoglobin: 10.4 g/dL — ABNORMAL LOW (ref 13.0–17.0)
Hemoglobin: 10.5 g/dL — ABNORMAL LOW (ref 13.0–17.0)
MCH: 30.3 pg (ref 26.0–34.0)
MCH: 30.6 pg (ref 26.0–34.0)
MCHC: 36.8 g/dL — ABNORMAL HIGH (ref 30.0–36.0)
MCHC: 36.6 g/dL — ABNORMAL HIGH (ref 30.0–36.0)
MCV: 83.4 fL (ref 78.0–100.0)
MCV: 81.8 fL (ref 78.0–100.0)
MCV: 82.9 fL (ref 78.0–100.0)
Platelets: 147 10*3/uL — ABNORMAL LOW (ref 150–400)
Platelets: 121 10*3/uL — ABNORMAL LOW (ref 150–400)
RBC: 3.68 MIL/uL — ABNORMAL LOW (ref 4.22–5.81)
RBC: 3.66 MIL/uL — ABNORMAL LOW (ref 4.22–5.81)
RBC: 3.3 MIL/uL — ABNORMAL LOW (ref 4.22–5.81)
RBC: 3.46 MIL/uL — ABNORMAL LOW (ref 4.22–5.81)
WBC: 6.7 10*3/uL (ref 4.0–10.5)
WBC: 7.5 10*3/uL (ref 4.0–10.5)
WBC: 7.2 10*3/uL (ref 4.0–10.5)
WBC: 10.3 10*3/uL (ref 4.0–10.5)
WBC: 11.1 10*3/uL — ABNORMAL HIGH (ref 4.0–10.5)

## 2013-06-02 LAB — TYPE AND SCREEN
Antibody Screen: NEGATIVE
Unit division: 0
Unit division: 0

## 2013-06-02 LAB — PREPARE FRESH FROZEN PLASMA: Unit division: 0

## 2013-06-02 LAB — POCT I-STAT 3, ART BLOOD GAS (G3+)
Acid-base deficit: 10 mmol/L — ABNORMAL HIGH (ref 0.0–2.0)
O2 Saturation: 96 %
Patient temperature: 101.2

## 2013-06-02 LAB — BLOOD GAS, ARTERIAL
Acid-base deficit: 8.2 mmol/L — ABNORMAL HIGH (ref 0.0–2.0)
Drawn by: 34779
FIO2: 0.4 %
O2 Saturation: 99.3 %
PEEP: 5 cmH2O
Patient temperature: 101
RATE: 35 resp/min
pO2, Arterial: 128 mmHg — ABNORMAL HIGH (ref 80.0–100.0)

## 2013-06-02 LAB — APTT: aPTT: 34 seconds (ref 24–37)

## 2013-06-02 LAB — PROTIME-INR: INR: 1.45 (ref 0.00–1.49)

## 2013-06-02 MED ORDER — SODIUM BICARBONATE 8.4 % IV SOLN
INTRAVENOUS | Status: DC
  Administered 2013-06-02 – 2013-06-04 (×4): via INTRAVENOUS
  Filled 2013-06-02 (×7): qty 150

## 2013-06-02 MED ORDER — CHLORHEXIDINE GLUCONATE 0.12 % MT SOLN
15.0000 mL | Freq: Two times a day (BID) | OROMUCOSAL | Status: DC
  Administered 2013-06-02 – 2013-06-03 (×4): 15 mL via OROMUCOSAL
  Filled 2013-06-02 (×4): qty 15

## 2013-06-02 MED ORDER — HYDROCORTISONE SOD SUCCINATE 100 MG IJ SOLR
50.0000 mg | Freq: Four times a day (QID) | INTRAMUSCULAR | Status: DC
  Administered 2013-06-02 – 2013-06-04 (×9): 50 mg via INTRAVENOUS
  Filled 2013-06-02 (×14): qty 1

## 2013-06-02 MED ORDER — DEXTROSE 50 % IV SOLN
25.0000 g | Freq: Once | INTRAVENOUS | Status: AC
  Administered 2013-06-02: 25 g via INTRAVENOUS

## 2013-06-02 MED ORDER — DEXTROSE 50 % IV SOLN
INTRAVENOUS | Status: AC
  Administered 2013-06-02: 25 mL
  Filled 2013-06-02: qty 50

## 2013-06-02 MED ORDER — VITAL AF 1.2 CAL PO LIQD
1000.0000 mL | ORAL | Status: DC
  Administered 2013-06-02: 1000 mL
  Filled 2013-06-02 (×3): qty 1000

## 2013-06-02 MED ORDER — DEXTROSE 10 % IV SOLN
INTRAVENOUS | Status: DC
  Administered 2013-06-02: 40 mL/h via INTRAVENOUS

## 2013-06-02 MED ORDER — LORAZEPAM 2 MG/ML IJ SOLN
2.0000 mg | Freq: Three times a day (TID) | INTRAMUSCULAR | Status: DC
  Administered 2013-06-02 (×3): 2 mg via INTRAVENOUS
  Filled 2013-06-02 (×3): qty 1

## 2013-06-02 MED ORDER — DEXMEDETOMIDINE HCL IN NACL 200 MCG/50ML IV SOLN
0.0000 ug/kg/h | INTRAVENOUS | Status: DC
  Administered 2013-06-02: 0.2 ug/kg/h via INTRAVENOUS
  Administered 2013-06-03: 0.5 ug/kg/h via INTRAVENOUS
  Filled 2013-06-02 (×4): qty 50

## 2013-06-02 MED ORDER — BIOTENE DRY MOUTH MT LIQD: 15.0000 mL | OROMUCOSAL | Status: DC | PRN

## 2013-06-02 NOTE — Progress Notes (Signed)
PULMONARY / CRITICAL CARE MEDICINE  Name: Micheal Daniel MRN: [id number] DOB: 03-Nov-1955    ADMISSION DATE:  05/30/2013 CONSULTATION DATE:  06/01/13  REFERRING MD :  Danise Edge  PRIMARY SERVICE: PCCM   CHIEF COMPLAINT:  Shock   BRIEF PATIENT DESCRIPTION: 57 yo with h/o ETOH and Hep C admitted 12/19 for abd pain. Found to have liver mass. Developed hemorrhagic shock related to perihepatic hemorrhage from subcapsular lesion. Suffered VT arrest 12/20, transferred to ICU.  SIGNIFICANT EVENTS / STUDIES:  12/19  Admitted with abdominal pain, liver mass 12/19  EGD >>> NO esophageal mass 12/20  Perihepatic hemorrhage, VT arrest x 4 minutes, transfused PRBC x 3 12/20 CT abdomen >>> Suspected active perihepatic hemorrhage of a subcapsular lesion in the anterior segment right hepatic lobe. Associated moderate perihepatic hemorrhage along the right hepatic lobe. Mucosal irregularity/eccentric wall thickening involving the distal esophagus just above the GE junction, worrisome for primary esophageal neoplasm.  Innumerable hepatic metastases, as described above. Suspected lower paraesophageal and upper abdominal nodal metastases. Moderate abdominopelvic ascites. 12/20  IR for emergent hepatic/messenteric embolization  12/22  Goals of care meeting >>> no consensus  LINES / TUBES: R IJ CVL 12/20 >>> R fem A.Line 12/20 >>> OETT 12/20 >>> OGT 12/20 >>> Foley 12/20 >>>  CULTURES:  ANTIBIOTICS:  SUBJECTIVE:  No changes overnight.  VITAL SIGNS: Temp:  [94.5 F (34.7 C)-98.3 F (36.8 C)] 97.8 F (36.6 C) (12/22 1205) Pulse Rate:  [53-87] 60 (12/22 1213) Resp:  [16-33] 22 (12/22 1213) BP: (85-145)/(49-80) 105/71 mmHg (12/22 1200) SpO2:  [98 %-100 %] 100 % (12/22 1213) Arterial Line BP: (91-116)/(45-71) 116/70 mmHg (12/22 1200) FiO2 (%):  [40 %-50 %] 40 % (12/22 1213) Weight:  [68.9 kg (151 lb 14.4 oz)] 68.9 kg (151 lb 14.4 oz) (12/22 0500)  HEMODYNAMICS: CVP:  [10 mmHg-23 mmHg] 22  mmHg  VENTILATOR SETTINGS: Vent Mode:  [-] PRVC FiO2 (%):  [40 %-50 %] 40 % Set Rate:  [16 bmp-30 bmp] 16 bmp Vt Set:  [500 mL] 500 mL PEEP:  [5 cmH20] 5 cmH20 Plateau Pressure:  [21 cmH20-31 cmH20] 28 cmH20 INTAKE / OUTPUT: Intake/Output     12/21 0701 - 12/22 0700 12/22 0701 - 12/23 0700   I.V. (mL/kg) 2610.8 (37.9) 190 (2.8)   Blood     NG/GT 450 240   Total Intake(mL/kg) 3060.8 (44.4) 430 (6.2)   Urine (mL/kg/hr) 1255 (0.8) 45 (0.1)   Total Output 1255 45   Net +1805.8 +385          PHYSICAL EXAMINATION: General:  Mechanically ventilated, intermittently asynchronous Neuro:  Sedated, withdraws to pain HEENT: OETT / OGT Cardiovascular:  Regular, no murmurs Lungs:  Bilateral rhonchi Abdomen:  Distended, non tender, bowel sounds diminished Musculoskeletal: Moves all extremities Skin:  No rash   LABS:  CBC  Recent Labs Lab 06/02/13 1512 06/02/13 1912 06/03/13 0312  WBC 10.3 11.1* 12.2*  HGB 10.4* 10.5* 11.4*  HCT 28.7* 28.7* 30.8*  PLT 121* 121* 110*   Coag's  Recent Labs Lab 06/01/13 1026 06/02/13 0659 06/03/13 0312  APTT 34 34 35  INR 2.27* 1.45 1.49   BMET  Recent Labs Lab 06/01/13 1300 06/02/13 0135 06/03/13 0312  NA 135 139 139  K 4.0 3.5 2.4*  CL 111 111 108  CO2 11* 13* 18*  BUN 28* 26* 21  CREATININE 1.31 1.26 0.97  GLUCOSE 128* 211* 197*   Electrolytes  Recent Labs Lab 05/30/13 0105  05/31/13 0455  06/01/13 1300 06/02/13  1610 06/03/13 0312  CALCIUM 8.4  < > 8.1*  < > 6.8* 7.4* 7.5*  MG 2.2  --   --   --   --   --   --   PHOS 5.5*  --  5.1*  --   --   --   --   < > = values in this interval not displayed. Sepsis Markers  Recent Labs Lab 05/30/13 1350 06/01/13 1030  LATICACIDVEN 5.1* 9.3*   ABG  Recent Labs Lab 06/02/13 0936 06/03/13 0421 06/03/13 0640  PHART 7.361 7.637* 7.421  PCO2ART 24.9* 17.6* 30.7*  PO2ART 93.0 55.7* 124.0*   Liver Enzymes  Recent Labs Lab 06/01/13 1030 06/02/13 0135 06/03/13 0312   AST 1010* 1011* 594*  ALT 168* 167* 116*  ALKPHOS 377* 366* 370*  BILITOT 1.7* 3.7* 4.7*  ALBUMIN 1.1* 2.1* 1.7*   Cardiac Enzymes  Recent Labs Lab 06/01/13 1745  TROPONINI <0.30   Glucose  Recent Labs Lab 06/02/13 1826 06/02/13 1947 06/02/13 2358 06/03/13 0404 06/03/13 0800 06/03/13 1128  GLUCAP 125* 134* 161* 184* 164* 154*   CXR: 12/22 >>> Hardware in good position, no overt airspace disease  ASSESSMENT / PLAN:  PULMONARY A: Acute respiratory failure in setting of cardiac arrest P:   Goal pH>7.30, SpO2>92 Continuous mechanical support VAP bundle Daily SBT Trend ABG/CXR  CARDIOVASCULAR A: Hemorrhagic shock, resolved. VT arrest in setting of acute hemorrhage. P:  Goal MAP>65  RENAL A:  Acute renal failure in setting of shock / cardiac arrest. Hypokalemia. P:   Trend BMP Bicarbonate gtt K 40 x 3  GASTROINTESTINAL A:  Perihepatic hemorrhage from subcapsular lesion, s/p embolization. Numerous liver mass lesions (HCC? Mx?). Elevated liver enzymes in setting of liver mass lesions / shock. H/o Hep C.  Suspected liver cirrhosis. P:   NPO TF Protonix for GI Px  HEMATOLOGIC A:  Anemia of acute blood loss.  Coagulopathy ( liver failure? DIC? ) - resolved. P:  Trend CBC Trend APTT / INR SCD  INFECTIOUS A:  No active issues. P:   No intervention required  ENDOCRINE A:  Hypoglycemia. Suspected adrenal insufficiency (cortisol 16 in presence of shock). P:   CBG Hydrocortisone  NEUROLOGIC A:  Acute encephalopathy.  Possible anoxia.  At risk for ETOH withdrawal. Possible anoxia. P:   Fentanyl gtt D/c Fentanyl PRN Precedex gtt D/c Ativan   Folate / Thiamin / MVT  I have personally obtained history, examined patient, evaluated and interpreted laboratory and imaging results, reviewed medical records, formulated assessment / plan and placed orders.  CRITICAL CARE:  The patient is critically ill with multiple organ systems failure and requires high  complexity decision making for assessment and support, frequent evaluation and titration of therapies, application of advanced monitoring technologies and extensive interpretation of multiple databases. Critical Care Time devoted to patient care services described in this note is 35 minutes.   Lonia Farber, MD Pulmonary and Critical Care Medicine [facility] Pager: [phone]  06/03/2013, 12:58 PM

## 2013-06-02 NOTE — Progress Notes (Signed)
2 Days Post-Op  Subjective: Pt still intubated,sedated; family in room  Objective: Vital signs in last 24 hours: Temp:  [95.4 F (35.2 C)-101.2 F (38.4 C)] 101.2 F (38.4 C) (12/21 0745) Pulse Rate:  [75-119] 103 (12/21 0945) Resp:  [30-36] 30 (12/21 0945) BP: (107-160)/(63-104) 134/78 mmHg (12/21 0800) SpO2:  [99 %-100 %] 100 % (12/21 0945) Arterial Line BP: (115-217)/(58-138) 115/59 mmHg (12/21 0945) FiO2 (%):  [40 %-100 %] 40 % (12/21 0910) Last BM Date: 05/31/13  Intake/Output from previous day: 12/20 0701 - 12/21 0700 In: 3304.8 [I.V.:1923.3; Blood:1381.5] Out: 575 [Urine:575] Intake/Output this shift:   abd dist, hypoactive, left CFA sheath intact, no hematoma   Lab Results:   Recent Labs  06/02/13 0135 06/02/13 0712  WBC 6.7 7.5  HGB 11.2* 10.9*  HCT 30.7* 30.4*  PLT 146* 147*   BMET  Recent Labs  06/01/13 1300 06/02/13 0135  NA 135 139  K 4.0 3.5  CL 111 111  CO2 11* 13*  GLUCOSE 128* 211*  BUN 28* 26*  CREATININE 1.31 1.26  CALCIUM 6.8* 7.4*   PT/INR  Recent Labs  06/01/13 1026 06/02/13 0659  LABPROT 24.3* 17.3*  INR 2.27* 1.45   ABG  Recent Labs  06/02/13 0515 06/02/13 0936  PHART 7.486* 7.361  HCO3 14.4* 13.9*    Studies/Results: Ir Angiogram Visceral Selective  06/01/2013   CLINICAL DATA:  Massive intraperitoneal hemorrhage secondary to active extravasation from a a liver lesion. The patient went into hypovolemic shock and cardiac arrest and was resuscitated. Emergency consent for hepatic embolization is requested. I am in agreement with the critical care physician.  EXAM: SELECTIVE VISCERAL ARTERIOGRAPHY; IR ULTRASOUND GUIDANCE VASC ACCESS LEFT; TRANSCATHETER THERAPY EMBOLIZATION; ARTERIOGRAPHY  MEDICATIONS AND MEDICAL HISTORY: Ventilated  FLUOROSCOPY TIME:  18 min and 24 seconds.  PROCEDURE: The procedure, risks, benefits, and alternatives were explained to the patient. Questions regarding the procedure were encouraged and  answered. The patient understands and consents to the procedure.  The left groin was prepped with Betadine in a sterile fashion, and a sterile drape was applied covering the operative field. A sterile gown and sterile gloves were used for the procedure.  Under sonographic guidance, a micropuncture needle was inserted into the left common femoral artery and removed over a 018 wire which was up sized to a Bentson. A 5 French sheath was inserted. A cobra 2 catheter was advanced over the wire to the upper aorta. The celiac was engaged. Cobra was advanced over the wire into the common hepatic artery. Angiography was performed. This demonstrated active extravasation in the posterior segment of the right lobe of the liver.  The cobra catheter was advanced beyond the gastroduodenal artery origin into the proper hepatic artery and additional angiographic imaging was performed.  After determining the suspect vessel, a micro catheter was advanced through the Cobra catheter and over an 018 glidewire into a sub selective branch in the right lobe of the liver. Gel-Foam slurry was utilized for embolization. A single 2 x 5 mm coil was deployed. Follow-up angiogram through the micro catheter demonstrates a wedge-shaped hypoperfused segment in the right lobe corresponding to the area of active extravasation.  The micro catheter was then utilized to select the other 2 large branches to the right lobe can repeat angiography confirmed no other source of active extravasation.  The cobra catheter was removed. The left groin 5 French sheath was flushed and secured in place. INR performed this morning was determined be 2.7. Angiography of  the left common femoral artery in preparation for EXOSEAL was performed.  COMPLICATIONS: None  FINDINGS: Angiography through the common hepatic artery delineates anatomy.  Subsequent imaging of the proper hepatic artery demonstrates active extravasation of contrast within the right lobe of the liver  corresponding to the abnormality noted on CT.  Subsequent imaging demonstrates both Gel-Foam and coil embolization of the suspect vessel. Repeat angiography demonstrates resolution of the active extravasation.  Subsequent injection of other 3rd order vessels demonstrates no other source of active extravasation.  IMPRESSION: Diagnostic hepatic arterial angiography confirms active extravasation of contrast within the right lobe of the liver. The source vessel was selectively embolize utilizing Gel-Foam slurry and a single platinum coil. This was successful in eliminating active extravasation of bleeding from the liver parenchyma.  : CONTRAST:  100 cc Omnipaque 300   Electronically Signed   By: Maryclare Bean M.D.   On: 06/01/2013 12:52   Ir Transcath/emboliz  06/01/2013   CLINICAL DATA:  Massive intraperitoneal hemorrhage secondary to active extravasation from a a liver lesion. The patient went into hypovolemic shock and cardiac arrest and was resuscitated. Emergency consent for hepatic embolization is requested. I am in agreement with the critical care physician.  EXAM: SELECTIVE VISCERAL ARTERIOGRAPHY; IR ULTRASOUND GUIDANCE VASC ACCESS LEFT; TRANSCATHETER THERAPY EMBOLIZATION; ARTERIOGRAPHY  MEDICATIONS AND MEDICAL HISTORY: Ventilated  FLUOROSCOPY TIME:  18 min and 24 seconds.  PROCEDURE: The procedure, risks, benefits, and alternatives were explained to the patient. Questions regarding the procedure were encouraged and answered. The patient understands and consents to the procedure.  The left groin was prepped with Betadine in a sterile fashion, and a sterile drape was applied covering the operative field. A sterile gown and sterile gloves were used for the procedure.  Under sonographic guidance, a micropuncture needle was inserted into the left common femoral artery and removed over a 018 wire which was up sized to a Bentson. A 5 French sheath was inserted. A cobra 2 catheter was advanced over the wire to the  upper aorta. The celiac was engaged. Cobra was advanced over the wire into the common hepatic artery. Angiography was performed. This demonstrated active extravasation in the posterior segment of the right lobe of the liver.  The cobra catheter was advanced beyond the gastroduodenal artery origin into the proper hepatic artery and additional angiographic imaging was performed.  After determining the suspect vessel, a micro catheter was advanced through the Cobra catheter and over an 018 glidewire into a sub selective branch in the right lobe of the liver. Gel-Foam slurry was utilized for embolization. A single 2 x 5 mm coil was deployed. Follow-up angiogram through the micro catheter demonstrates a wedge-shaped hypoperfused segment in the right lobe corresponding to the area of active extravasation.  The micro catheter was then utilized to select the other 2 large branches to the right lobe can repeat angiography confirmed no other source of active extravasation.  The cobra catheter was removed. The left groin 5 French sheath was flushed and secured in place. INR performed this morning was determined be 2.7. Angiography of the left common femoral artery in preparation for EXOSEAL was performed.  COMPLICATIONS: None  FINDINGS: Angiography through the common hepatic artery delineates anatomy.  Subsequent imaging of the proper hepatic artery demonstrates active extravasation of contrast within the right lobe of the liver corresponding to the abnormality noted on CT.  Subsequent imaging demonstrates both Gel-Foam and coil embolization of the suspect vessel. Repeat angiography demonstrates resolution of the active extravasation.  Subsequent injection of other 3rd order vessels demonstrates no other source of active extravasation.  IMPRESSION: Diagnostic hepatic arterial angiography confirms active extravasation of contrast within the right lobe of the liver. The source vessel was selectively embolize utilizing Gel-Foam  slurry and a single platinum coil. This was successful in eliminating active extravasation of bleeding from the liver parenchyma.  : CONTRAST:  100 cc Omnipaque 300   Electronically Signed   By: Maryclare Bean M.D.   On: 06/01/2013 12:52   Ir Angiogram Follow Up Study  06/01/2013   CLINICAL DATA:  Massive intraperitoneal hemorrhage secondary to active extravasation from a a liver lesion. The patient went into hypovolemic shock and cardiac arrest and was resuscitated. Emergency consent for hepatic embolization is requested. I am in agreement with the critical care physician.  EXAM: SELECTIVE VISCERAL ARTERIOGRAPHY; IR ULTRASOUND GUIDANCE VASC ACCESS LEFT; TRANSCATHETER THERAPY EMBOLIZATION; ARTERIOGRAPHY  MEDICATIONS AND MEDICAL HISTORY: Ventilated  FLUOROSCOPY TIME:  18 min and 24 seconds.  PROCEDURE: The procedure, risks, benefits, and alternatives were explained to the patient. Questions regarding the procedure were encouraged and answered. The patient understands and consents to the procedure.  The left groin was prepped with Betadine in a sterile fashion, and a sterile drape was applied covering the operative field. A sterile gown and sterile gloves were used for the procedure.  Under sonographic guidance, a micropuncture needle was inserted into the left common femoral artery and removed over a 018 wire which was up sized to a Bentson. A 5 French sheath was inserted. A cobra 2 catheter was advanced over the wire to the upper aorta. The celiac was engaged. Cobra was advanced over the wire into the common hepatic artery. Angiography was performed. This demonstrated active extravasation in the posterior segment of the right lobe of the liver.  The cobra catheter was advanced beyond the gastroduodenal artery origin into the proper hepatic artery and additional angiographic imaging was performed.  After determining the suspect vessel, a micro catheter was advanced through the Cobra catheter and over an 018 glidewire  into a sub selective branch in the right lobe of the liver. Gel-Foam slurry was utilized for embolization. A single 2 x 5 mm coil was deployed. Follow-up angiogram through the micro catheter demonstrates a wedge-shaped hypoperfused segment in the right lobe corresponding to the area of active extravasation.  The micro catheter was then utilized to select the other 2 large branches to the right lobe can repeat angiography confirmed no other source of active extravasation.  The cobra catheter was removed. The left groin 5 French sheath was flushed and secured in place. INR performed this morning was determined be 2.7. Angiography of the left common femoral artery in preparation for EXOSEAL was performed.  COMPLICATIONS: None  FINDINGS: Angiography through the common hepatic artery delineates anatomy.  Subsequent imaging of the proper hepatic artery demonstrates active extravasation of contrast within the right lobe of the liver corresponding to the abnormality noted on CT.  Subsequent imaging demonstrates both Gel-Foam and coil embolization of the suspect vessel. Repeat angiography demonstrates resolution of the active extravasation.  Subsequent injection of other 3rd order vessels demonstrates no other source of active extravasation.  IMPRESSION: Diagnostic hepatic arterial angiography confirms active extravasation of contrast within the right lobe of the liver. The source vessel was selectively embolize utilizing Gel-Foam slurry and a single platinum coil. This was successful in eliminating active extravasation of bleeding from the liver parenchyma.  : CONTRAST:  100 cc Omnipaque 300  Electronically Signed   By: Maryclare Bean M.D.   On: 06/01/2013 12:52   Ir US Guide Vasc Access Left  06/01/2013   CLINICAL DATA:  Massive intraperitoneal hemorrhage secondary to active extravasation from a a liver lesion. The patient went into hypovolemic shock and cardiac arrest and was resuscitated. Emergency consent for hepatic  embolization is requested. I am in agreement with the critical care physician.  EXAM: SELECTIVE VISCERAL ARTERIOGRAPHY; IR ULTRASOUND GUIDANCE VASC ACCESS LEFT; TRANSCATHETER THERAPY EMBOLIZATION; ARTERIOGRAPHY  MEDICATIONS AND MEDICAL HISTORY: Ventilated  FLUOROSCOPY TIME:  18 min and 24 seconds.  PROCEDURE: The procedure, risks, benefits, and alternatives were explained to the patient. Questions regarding the procedure were encouraged and answered. The patient understands and consents to the procedure.  The left groin was prepped with Betadine in a sterile fashion, and a sterile drape was applied covering the operative field. A sterile gown and sterile gloves were used for the procedure.  Under sonographic guidance, a micropuncture needle was inserted into the left common femoral artery and removed over a 018 wire which was up sized to a Bentson. A 5 French sheath was inserted. A cobra 2 catheter was advanced over the wire to the upper aorta. The celiac was engaged. Cobra was advanced over the wire into the common hepatic artery. Angiography was performed. This demonstrated active extravasation in the posterior segment of the right lobe of the liver.  The cobra catheter was advanced beyond the gastroduodenal artery origin into the proper hepatic artery and additional angiographic imaging was performed.  After determining the suspect vessel, a micro catheter was advanced through the Cobra catheter and over an 018 glidewire into a sub selective branch in the right lobe of the liver. Gel-Foam slurry was utilized for embolization. A single 2 x 5 mm coil was deployed. Follow-up angiogram through the micro catheter demonstrates a wedge-shaped hypoperfused segment in the right lobe corresponding to the area of active extravasation.  The micro catheter was then utilized to select the other 2 large branches to the right lobe can repeat angiography confirmed no other source of active extravasation.  The cobra catheter was  removed. The left groin 5 French sheath was flushed and secured in place. INR performed this morning was determined be 2.7. Angiography of the left common femoral artery in preparation for EXOSEAL was performed.  COMPLICATIONS: None  FINDINGS: Angiography through the common hepatic artery delineates anatomy.  Subsequent imaging of the proper hepatic artery demonstrates active extravasation of contrast within the right lobe of the liver corresponding to the abnormality noted on CT.  Subsequent imaging demonstrates both Gel-Foam and coil embolization of the suspect vessel. Repeat angiography demonstrates resolution of the active extravasation.  Subsequent injection of other 3rd order vessels demonstrates no other source of active extravasation.  IMPRESSION: Diagnostic hepatic arterial angiography confirms active extravasation of contrast within the right lobe of the liver. The source vessel was selectively embolize utilizing Gel-Foam slurry and a single platinum coil. This was successful in eliminating active extravasation of bleeding from the liver parenchyma.  : CONTRAST:  100 cc Omnipaque 300   Electronically Signed   By: Maryclare Bean M.D.   On: 06/01/2013 12:52   Dg Chest Port 1 View  06/02/2013   CLINICAL DATA:  Intubated.  EXAM: PORTABLE CHEST - 1 VIEW  COMPARISON:  06/01/2013  FINDINGS: Endotracheal tube remains in stable position. Low lung volumes with bibasilar atelectasis. Heart is normal size. No visible significant effusions.  IMPRESSION: Low lung volumes, bibasilar  atelectasis.   Electronically Signed   By: Charlett Nose M.D.   On: 06/02/2013 07:32   Dg Chest Port 1 View  06/01/2013   CLINICAL DATA:  Evaluate endotracheal tube placement and central line placement.  EXAM: PORTABLE CHEST - 1 VIEW  COMPARISON:  05/30/2013.  FINDINGS: Defibrillator pads overlie the chest. Endotracheal tube is present with the tip 3 cm from the carina. Right IJ vascular sheath is present. There is no pneumothorax. Left  basilar atelectasis. Low volumes accentuate the size of the cardiopericardial silhouette and produce tortuosity of the thoracic aorta. Enteric contrast is present in the left upper quadrant.  IMPRESSION: 1. Endotracheal tube and uncomplicated right IJ vascular sheath. Endotracheal tube tip 3 cm from the carina. 2. Left basilar atelectasis.   Electronically Signed   By: Andreas Newport M.D.   On: 06/01/2013 09:43    Anti-infectives: Anti-infectives   None      Assessment/Plan: S/p right hepatic arterial coiling/gelfoam secondary to perihepatic/intraperitoneal hemorrhage(liver lesions) 12/20; Hgb currently 10.9; plan for left femoral arterial sheath removal later today.Other plans as per CCM.   LOS: 3 days    Adysson Revelle,D [facility] 06/02/2013

## 2013-06-02 NOTE — Consult Note (Signed)
Name: Micheal Daniel MRN: [id number] DOB: [birth year]    ADMISSION DATE:  05/30/2013 CONSULTATION DATE:  06/01/13  REFERRING MD :  Danise Edge  PRIMARY SERVICE: PCCM   CHIEF COMPLAINT:  Shock   BRIEF PATIENT DESCRIPTION: 57 yo ETOH /Hep C admitted 12/19 for  abd pain. Found to have liver mets (primary unknown ) . Developed hemorrhagic shock from active perihepatic hemorrhage from subcapsular lesion . VT arrest s/p code on  12/20 transferred to ICU with PCCM assuming care.   SIGNIFICANT EVENTS / STUDIES:  12/20 perihepatic hemorrhage /VT arrest tranx to ICU , coded x 4 min  , transfused 3 u PRBC  12/20 IR for emergent hepatic/messenteric embolization  12/20 CT abd >Suspected active perihepatic hemorrhage of a subcapsular lesion in  the anterior segment right hepatic lobe. Associated moderate perihepatic hemorrhage along the right hepatic lobe. Mucosal irregularity/eccentric wall thickening involving the distal esophagus just above the GE junction, worrisome for primary esophageal neoplasm.  Innumerable hepatic metastases, as described above. Suspected lower paraesophageal and upper abdominal nodal metastases.  Moderate abdominopelvic ascites. 12/20- embolized  LINES / TUBES: 12/20 R CVC cordis >> 12/20 R. Fem A.Line >> 12/20 ETT >>  CULTURES:  ANTIBIOTICS:  SUBJECTIVE: alkalotic, no pressors  VITAL SIGNS: Temp:  [95.4 F (35.2 C)-101 F (38.3 C)] 101 F (38.3 C) (12/21 0456) Pulse Rate:  [29-117] 100 (12/21 0317) Resp:  [19-49] 35 (12/21 0317) BP: (95-160)/(58-104) 136/63 mmHg (12/21 0317) SpO2:  [60 %-100 %] 100 % (12/21 0317) Arterial Line BP: (136-217)/(66-138) 136/66 mmHg (12/21 0300) FiO2 (%):  [40 %-100 %] 40 % (12/21 0629) HEMODYNAMICS:   VENTILATOR SETTINGS: Vent Mode:  [-] PRVC FiO2 (%):  [40 %-100 %] 40 % Set Rate:  [20 bmp-35 bmp] 30 bmp Vt Set:  [500 mL] 500 mL PEEP:  [5 cmH20] 5 cmH20 Plateau Pressure:  [20 cmH20-36 cmH20] 26 cmH20 INTAKE /  OUTPUT: Intake/Output     12/20 0701 - 12/21 0700 12/21 0701 - 12/22 0700   I.V. (mL/kg) 1923.3 (38.5)    Blood 1381.5    Total Intake(mL/kg) 3304.8 (66.1)    Urine (mL/kg/hr) 575 (0.5)    Stool     Total Output 575     Net +2729.8            PHYSICAL EXAMINATION: General:  Sedated on vent  Neuro:  rass -3 HEENT: ETT in place  Cardiovascular:  s1 s2 RRR Lungs:  CTA  Abdomen:  Distended,bs hypo , large liver border Musculoskeletal: intact  Skin:  Intact , no rash   LABS:  CBC  Recent Labs Lab 06/01/13 1300 06/01/13 1800 06/02/13 0135  WBC 8.2 8.9 6.7  HGB 9.8* 13.4 11.2*  HCT 27.8* 37.8* 30.7*  PLT 149* 149* 146*   Coag's  Recent Labs Lab 05/30/13 1633 06/01/13 1026  APTT 32 34  INR 1.17 2.27*   BMET  Recent Labs Lab 06/01/13 1030 06/01/13 1300 06/02/13 0135  NA 138 135 139  K 3.3* 4.0 3.5  CL 111 111 111  CO2 9* 11* 13*  BUN 26* 28* 26*  CREATININE 1.41* 1.31 1.26  GLUCOSE 175* 128* 211*   Electrolytes  Recent Labs Lab 05/30/13 0105  05/31/13 0455  06/01/13 1030 06/01/13 1300 06/02/13 0135  CALCIUM 8.4  < > 8.1*  < > 6.5* 6.8* 7.4*  MG 2.2  --   --   --   --   --   --   PHOS 5.5*  --  5.1*  --   --   --   --   < > = values in this interval not displayed. Sepsis Markers  Recent Labs Lab 05/30/13 1350 06/01/13 1030  LATICACIDVEN 5.1* 9.3*   ABG  Recent Labs Lab 06/01/13 1001 06/01/13 1257 06/02/13 0515  PHART 7.183* 7.430 7.486*  PCO2ART 24.6* 18.7* 19.5*  PO2ART 328.0* 308.0* 128.0*   Liver Enzymes  Recent Labs Lab 06/01/13 0415 06/01/13 1030 06/02/13 0135  AST 1628* 1010* 1011*  ALT 262* 168* 167*  ALKPHOS 582* 377* 366*  BILITOT 2.6* 1.7* 3.7*  ALBUMIN 1.6* 1.1* 2.1*   Cardiac Enzymes  Recent Labs Lab 06/01/13 1745  TROPONINI <0.30   Glucose  Recent Labs Lab 06/01/13 1532 06/01/13 1956 06/02/13 0038 06/02/13 0115 06/02/13 0423 06/02/13 0449  GLUCAP 96 79 66* 117* 58* 104*    Imaging Ir  Angiogram Visceral Selective  06/01/2013   CLINICAL DATA:  Massive intraperitoneal hemorrhage secondary to active extravasation from a a liver lesion. The patient went into hypovolemic shock and cardiac arrest and was resuscitated. Emergency consent for hepatic embolization is requested. I am in agreement with the critical care physician.  EXAM: SELECTIVE VISCERAL ARTERIOGRAPHY; IR ULTRASOUND GUIDANCE VASC ACCESS LEFT; TRANSCATHETER THERAPY EMBOLIZATION; ARTERIOGRAPHY  MEDICATIONS AND MEDICAL HISTORY: Ventilated  FLUOROSCOPY TIME:  18 min and 24 seconds.  PROCEDURE: The procedure, risks, benefits, and alternatives were explained to the patient. Questions regarding the procedure were encouraged and answered. The patient understands and consents to the procedure.  The left groin was prepped with Betadine in a sterile fashion, and a sterile drape was applied covering the operative field. A sterile gown and sterile gloves were used for the procedure.  Under sonographic guidance, a micropuncture needle was inserted into the left common femoral artery and removed over a 018 wire which was up sized to a Bentson. A 5 French sheath was inserted. A cobra 2 catheter was advanced over the wire to the upper aorta. The celiac was engaged. Cobra was advanced over the wire into the common hepatic artery. Angiography was performed. This demonstrated active extravasation in the posterior segment of the right lobe of the liver.  The cobra catheter was advanced beyond the gastroduodenal artery origin into the proper hepatic artery and additional angiographic imaging was performed.  After determining the suspect vessel, a micro catheter was advanced through the Cobra catheter and over an 018 glidewire into a sub selective branch in the right lobe of the liver. Gel-Foam slurry was utilized for embolization. A single 2 x 5 mm coil was deployed. Follow-up angiogram through the micro catheter demonstrates a wedge-shaped hypoperfused  segment in the right lobe corresponding to the area of active extravasation.  The micro catheter was then utilized to select the other 2 large branches to the right lobe can repeat angiography confirmed no other source of active extravasation.  The cobra catheter was removed. The left groin 5 French sheath was flushed and secured in place. INR performed this morning was determined be 2.7. Angiography of the left common femoral artery in preparation for EXOSEAL was performed.  COMPLICATIONS: None  FINDINGS: Angiography through the common hepatic artery delineates anatomy.  Subsequent imaging of the proper hepatic artery demonstrates active extravasation of contrast within the right lobe of the liver corresponding to the abnormality noted on CT.  Subsequent imaging demonstrates both Gel-Foam and coil embolization of the suspect vessel. Repeat angiography demonstrates resolution of the active extravasation.  Subsequent injection of other 3rd order vessels  demonstrates no other source of active extravasation.  IMPRESSION: Diagnostic hepatic arterial angiography confirms active extravasation of contrast within the right lobe of the liver. The source vessel was selectively embolize utilizing Gel-Foam slurry and a single platinum coil. This was successful in eliminating active extravasation of bleeding from the liver parenchyma.  : CONTRAST:  100 cc Omnipaque 300   Electronically Signed   By: Maryclare Bean M.D.   On: 06/01/2013 12:52   Ir Transcath/emboliz  06/01/2013   CLINICAL DATA:  Massive intraperitoneal hemorrhage secondary to active extravasation from a a liver lesion. The patient went into hypovolemic shock and cardiac arrest and was resuscitated. Emergency consent for hepatic embolization is requested. I am in agreement with the critical care physician.  EXAM: SELECTIVE VISCERAL ARTERIOGRAPHY; IR ULTRASOUND GUIDANCE VASC ACCESS LEFT; TRANSCATHETER THERAPY EMBOLIZATION; ARTERIOGRAPHY  MEDICATIONS AND MEDICAL  HISTORY: Ventilated  FLUOROSCOPY TIME:  18 min and 24 seconds.  PROCEDURE: The procedure, risks, benefits, and alternatives were explained to the patient. Questions regarding the procedure were encouraged and answered. The patient understands and consents to the procedure.  The left groin was prepped with Betadine in a sterile fashion, and a sterile drape was applied covering the operative field. A sterile gown and sterile gloves were used for the procedure.  Under sonographic guidance, a micropuncture needle was inserted into the left common femoral artery and removed over a 018 wire which was up sized to a Bentson. A 5 French sheath was inserted. A cobra 2 catheter was advanced over the wire to the upper aorta. The celiac was engaged. Cobra was advanced over the wire into the common hepatic artery. Angiography was performed. This demonstrated active extravasation in the posterior segment of the right lobe of the liver.  The cobra catheter was advanced beyond the gastroduodenal artery origin into the proper hepatic artery and additional angiographic imaging was performed.  After determining the suspect vessel, a micro catheter was advanced through the Cobra catheter and over an 018 glidewire into a sub selective branch in the right lobe of the liver. Gel-Foam slurry was utilized for embolization. A single 2 x 5 mm coil was deployed. Follow-up angiogram through the micro catheter demonstrates a wedge-shaped hypoperfused segment in the right lobe corresponding to the area of active extravasation.  The micro catheter was then utilized to select the other 2 large branches to the right lobe can repeat angiography confirmed no other source of active extravasation.  The cobra catheter was removed. The left groin 5 French sheath was flushed and secured in place. INR performed this morning was determined be 2.7. Angiography of the left common femoral artery in preparation for EXOSEAL was performed.  COMPLICATIONS: None   FINDINGS: Angiography through the common hepatic artery delineates anatomy.  Subsequent imaging of the proper hepatic artery demonstrates active extravasation of contrast within the right lobe of the liver corresponding to the abnormality noted on CT.  Subsequent imaging demonstrates both Gel-Foam and coil embolization of the suspect vessel. Repeat angiography demonstrates resolution of the active extravasation.  Subsequent injection of other 3rd order vessels demonstrates no other source of active extravasation.  IMPRESSION: Diagnostic hepatic arterial angiography confirms active extravasation of contrast within the right lobe of the liver. The source vessel was selectively embolize utilizing Gel-Foam slurry and a single platinum coil. This was successful in eliminating active extravasation of bleeding from the liver parenchyma.  : CONTRAST:  100 cc Omnipaque 300   Electronically Signed   By: Mia Creek.D.  On: 06/01/2013 12:52   Ir Angiogram Follow Up Study  06/01/2013   CLINICAL DATA:  Massive intraperitoneal hemorrhage secondary to active extravasation from a a liver lesion. The patient went into hypovolemic shock and cardiac arrest and was resuscitated. Emergency consent for hepatic embolization is requested. I am in agreement with the critical care physician.  EXAM: SELECTIVE VISCERAL ARTERIOGRAPHY; IR ULTRASOUND GUIDANCE VASC ACCESS LEFT; TRANSCATHETER THERAPY EMBOLIZATION; ARTERIOGRAPHY  MEDICATIONS AND MEDICAL HISTORY: Ventilated  FLUOROSCOPY TIME:  18 min and 24 seconds.  PROCEDURE: The procedure, risks, benefits, and alternatives were explained to the patient. Questions regarding the procedure were encouraged and answered. The patient understands and consents to the procedure.  The left groin was prepped with Betadine in a sterile fashion, and a sterile drape was applied covering the operative field. A sterile gown and sterile gloves were used for the procedure.  Under sonographic guidance, a  micropuncture needle was inserted into the left common femoral artery and removed over a 018 wire which was up sized to a Bentson. A 5 French sheath was inserted. A cobra 2 catheter was advanced over the wire to the upper aorta. The celiac was engaged. Cobra was advanced over the wire into the common hepatic artery. Angiography was performed. This demonstrated active extravasation in the posterior segment of the right lobe of the liver.  The cobra catheter was advanced beyond the gastroduodenal artery origin into the proper hepatic artery and additional angiographic imaging was performed.  After determining the suspect vessel, a micro catheter was advanced through the Cobra catheter and over an 018 glidewire into a sub selective branch in the right lobe of the liver. Gel-Foam slurry was utilized for embolization. A single 2 x 5 mm coil was deployed. Follow-up angiogram through the micro catheter demonstrates a wedge-shaped hypoperfused segment in the right lobe corresponding to the area of active extravasation.  The micro catheter was then utilized to select the other 2 large branches to the right lobe can repeat angiography confirmed no other source of active extravasation.  The cobra catheter was removed. The left groin 5 French sheath was flushed and secured in place. INR performed this morning was determined be 2.7. Angiography of the left common femoral artery in preparation for EXOSEAL was performed.  COMPLICATIONS: None  FINDINGS: Angiography through the common hepatic artery delineates anatomy.  Subsequent imaging of the proper hepatic artery demonstrates active extravasation of contrast within the right lobe of the liver corresponding to the abnormality noted on CT.  Subsequent imaging demonstrates both Gel-Foam and coil embolization of the suspect vessel. Repeat angiography demonstrates resolution of the active extravasation.  Subsequent injection of other 3rd order vessels demonstrates no other source of  active extravasation.  IMPRESSION: Diagnostic hepatic arterial angiography confirms active extravasation of contrast within the right lobe of the liver. The source vessel was selectively embolize utilizing Gel-Foam slurry and a single platinum coil. This was successful in eliminating active extravasation of bleeding from the liver parenchyma.  : CONTRAST:  100 cc Omnipaque 300   Electronically Signed   By: Maryclare Bean M.D.   On: 06/01/2013 12:52   Ir US Guide Vasc Access Left  06/01/2013   CLINICAL DATA:  Massive intraperitoneal hemorrhage secondary to active extravasation from a a liver lesion. The patient went into hypovolemic shock and cardiac arrest and was resuscitated. Emergency consent for hepatic embolization is requested. I am in agreement with the critical care physician.  EXAM: SELECTIVE VISCERAL ARTERIOGRAPHY; IR ULTRASOUND GUIDANCE VASC ACCESS LEFT;  TRANSCATHETER THERAPY EMBOLIZATION; ARTERIOGRAPHY  MEDICATIONS AND MEDICAL HISTORY: Ventilated  FLUOROSCOPY TIME:  18 min and 24 seconds.  PROCEDURE: The procedure, risks, benefits, and alternatives were explained to the patient. Questions regarding the procedure were encouraged and answered. The patient understands and consents to the procedure.  The left groin was prepped with Betadine in a sterile fashion, and a sterile drape was applied covering the operative field. A sterile gown and sterile gloves were used for the procedure.  Under sonographic guidance, a micropuncture needle was inserted into the left common femoral artery and removed over a 018 wire which was up sized to a Bentson. A 5 French sheath was inserted. A cobra 2 catheter was advanced over the wire to the upper aorta. The celiac was engaged. Cobra was advanced over the wire into the common hepatic artery. Angiography was performed. This demonstrated active extravasation in the posterior segment of the right lobe of the liver.  The cobra catheter was advanced beyond the gastroduodenal  artery origin into the proper hepatic artery and additional angiographic imaging was performed.  After determining the suspect vessel, a micro catheter was advanced through the Cobra catheter and over an 018 glidewire into a sub selective branch in the right lobe of the liver. Gel-Foam slurry was utilized for embolization. A single 2 x 5 mm coil was deployed. Follow-up angiogram through the micro catheter demonstrates a wedge-shaped hypoperfused segment in the right lobe corresponding to the area of active extravasation.  The micro catheter was then utilized to select the other 2 large branches to the right lobe can repeat angiography confirmed no other source of active extravasation.  The cobra catheter was removed. The left groin 5 French sheath was flushed and secured in place. INR performed this morning was determined be 2.7. Angiography of the left common femoral artery in preparation for EXOSEAL was performed.  COMPLICATIONS: None  FINDINGS: Angiography through the common hepatic artery delineates anatomy.  Subsequent imaging of the proper hepatic artery demonstrates active extravasation of contrast within the right lobe of the liver corresponding to the abnormality noted on CT.  Subsequent imaging demonstrates both Gel-Foam and coil embolization of the suspect vessel. Repeat angiography demonstrates resolution of the active extravasation.  Subsequent injection of other 3rd order vessels demonstrates no other source of active extravasation.  IMPRESSION: Diagnostic hepatic arterial angiography confirms active extravasation of contrast within the right lobe of the liver. The source vessel was selectively embolize utilizing Gel-Foam slurry and a single platinum coil. This was successful in eliminating active extravasation of bleeding from the liver parenchyma.  : CONTRAST:  100 cc Omnipaque 300   Electronically Signed   By: Maryclare Bean M.D.   On: 06/01/2013 12:52   Dg Chest Port 1 View  06/02/2013   CLINICAL  DATA:  Intubated.  EXAM: PORTABLE CHEST - 1 VIEW  COMPARISON:  06/01/2013  FINDINGS: Endotracheal tube remains in stable position. Low lung volumes with bibasilar atelectasis. Heart is normal size. No visible significant effusions.  IMPRESSION: Low lung volumes, bibasilar atelectasis.   Electronically Signed   By: Charlett Nose M.D.   On: 06/02/2013 07:32   Dg Chest Port 1 View  06/01/2013   CLINICAL DATA:  Evaluate endotracheal tube placement and central line placement.  EXAM: PORTABLE CHEST - 1 VIEW  COMPARISON:  05/30/2013.  FINDINGS: Defibrillator pads overlie the chest. Endotracheal tube is present with the tip 3 cm from the carina. Right IJ vascular sheath is present. There is no pneumothorax. Left  basilar atelectasis. Low volumes accentuate the size of the cardiopericardial silhouette and produce tortuosity of the thoracic aorta. Enteric contrast is present in the left upper quadrant.  IMPRESSION: 1. Endotracheal tube and uncomplicated right IJ vascular sheath. Endotracheal tube tip 3 cm from the carina. 2. Left basilar atelectasis.   Electronically Signed   By: Andreas Newport M.D.   On: 06/01/2013 09:43     CXR: Endotracheal tube and uncomplicated right IJ vascular sheath.  Endotracheal tube tip 3 cm from the carina. . Left basilar atelectasis.   ASSESSMENT / PLAN:  PULMONARY A VDRF secondary to VT arrest, acidosis metabolic uncompendated  P:   abg reviewed, reduce rate further, abg to follow Pending repeat abg, may consider sbt, will see what MV needs are pcxr in am   CARDIOVASCULAR A: Hemorrhagic Shock  VT arrest in setting of acute hemorrhage, rel AI P:  Limit gross resuscitation today unless clinically changes SCD for DVT proph , no hep in setting of hemorrhage  Cortisol noted ,not on pressors, if shock then add stress steroids  RENAL A:  Acute  Renal Failure -secondary shock  Metabolic Acidosis lactic, now NON AG  (from saline resus) P:   Avoid saline Bicarb for NON  AG Chem in am   GASTROINTESTINAL A:  Perihepatic hemorrhage from subcapsular lesion -s/p embolization Liver Mets ? Primary  S/p EGD 12/19 w/ no esophageal mass  Hep C /ETOH abuse  Transaminitis   P:   lft again in am , with bili Fractionate bili consider TF, EGD was unimpressive as far as esoph lesion ppi  HEMATOLOGIC A:  Anemia -hemorrhagic, coagulapthy (liver dz, consumption)  P:  Cbc frequent today as some drop scd coags in am   INFECTIOUS A:  Severe hypothermia improved P:   No evidence infection  ENDOCRINE A:  Hypoglycemia continued, cortisol low, rel AI, r/o adrenal mets P:   Add stress roids with low glu this am  cbg d5 in fluids, may need d10  NEUROLOGIC A:  S/p VT arrest , sedated on ven, concern etoh wd P:   Sedation protocol w/ fent  Add around clock benzo for wd for now May consider precedex and limit benzo with lft  TODAY'S SUMMARY: add benzo, may need precedex, cbc to follow, bicarb, steroids  I have personally obtained a history, examined the patient, evaluated laboratory and imaging results, formulated the assessment and plan and placed orders. CRITICAL CARE: The patient is critically ill with multiple organ systems failure and requires high complexity decision making for assessment and support, frequent evaluation and titration of therapies, application of advanced monitoring technologies and extensive interpretation of multiple databases. Critical Care Time devoted to patient care services described in this note is  30 minutes.   Mcarthur Rossetti. Tyson Alias, MD, FACP Pgr: [phone] [facility]  [name] NP-C  Pulmonary and Critical Care Medicine [facility] Pager: [phone]  06/02/2013, 7:37 AM

## 2013-06-02 NOTE — Progress Notes (Signed)
Left Femoral 5 fr sheath removed at 1145 by Karen Kays. No complications. Site exoseal applied. No hematoma. Pressure dressing intact. Sandbag 5 lb on site.

## 2013-06-03 ENCOUNTER — Inpatient Hospital Stay (HOSPITAL_COMMUNITY): Payer: Medicaid Other

## 2013-06-03 ENCOUNTER — Encounter (HOSPITAL_COMMUNITY): Payer: Self-pay | Admitting: Gastroenterology

## 2013-06-03 DIAGNOSIS — G9340 Encephalopathy, unspecified: Secondary | ICD-10-CM

## 2013-06-03 DIAGNOSIS — I469 Cardiac arrest, cause unspecified: Secondary | ICD-10-CM | POA: Diagnosis present

## 2013-06-03 DIAGNOSIS — J9600 Acute respiratory failure, unspecified whether with hypoxia or hypercapnia: Secondary | ICD-10-CM | POA: Diagnosis present

## 2013-06-03 LAB — BLOOD GAS, ARTERIAL
Acid-base deficit: 2.1 mmol/L — ABNORMAL HIGH (ref 0.0–2.0)
Drawn by: 34779
MECHVT: 500 mL
Patient temperature: 97
RATE: 30 resp/min
TCO2: 19.8 mmol/L (ref 0–100)
pH, Arterial: 7.637 (ref 7.350–7.450)
pO2, Arterial: 55.7 mmHg — ABNORMAL LOW (ref 80.0–100.0)

## 2013-06-03 LAB — BASIC METABOLIC PANEL
BUN: 26 mg/dL — ABNORMAL HIGH (ref 6–23)
CO2: 21 mEq/L (ref 19–32)
Calcium: 7.3 mg/dL — ABNORMAL LOW (ref 8.4–10.5)
Creatinine, Ser: 0.98 mg/dL (ref 0.50–1.35)
GFR calc Af Amer: >90 mL/min (ref >90)
GFR calc non Af Amer: 89 mL/min — ABNORMAL LOW (ref >90)
Potassium: 3.7 mEq/L (ref 3.5–5.1)
Sodium: 143 mEq/L (ref 135–145)

## 2013-06-03 LAB — COMPREHENSIVE METABOLIC PANEL
ALT: 116 U/L — ABNORMAL HIGH (ref 0–53)
AST: 594 U/L — ABNORMAL HIGH (ref 0–37)
Albumin: 1.7 g/dL — ABNORMAL LOW (ref 3.5–5.2)
Calcium: 7.5 mg/dL — ABNORMAL LOW (ref 8.4–10.5)
Creatinine, Ser: 0.97 mg/dL (ref 0.50–1.35)
GFR calc Af Amer: >90 mL/min (ref >90)
Glucose, Bld: 197 mg/dL — ABNORMAL HIGH (ref 70–99)
Sodium: 139 mEq/L (ref 135–145)
Total Bilirubin: 4.7 mg/dL — ABNORMAL HIGH (ref 0.3–1.2)
Total Protein: 6.2 g/dL (ref 6.0–8.3)

## 2013-06-03 LAB — APTT: aPTT: 35 seconds (ref 24–37)

## 2013-06-03 LAB — GLUCOSE, CAPILLARY
Glucose-Capillary: 161 mg/dL — ABNORMAL HIGH (ref 70–99)
Glucose-Capillary: 171 mg/dL — ABNORMAL HIGH (ref 70–99)

## 2013-06-03 LAB — POCT I-STAT 3, ART BLOOD GAS (G3+)
Acid-base deficit: 4 mmol/L — ABNORMAL HIGH (ref 0.0–2.0)
Bicarbonate: 20.1 mEq/L (ref 20.0–24.0)
Patient temperature: 97.3
TCO2: 21 mmol/L (ref 0–100)

## 2013-06-03 LAB — CBC WITH DIFFERENTIAL/PLATELET
Band Neutrophils: 0 % (ref 0–10)
Blasts: 0 %
HCT: 30.8 % — ABNORMAL LOW (ref 39.0–52.0)
MCHC: 37 g/dL — ABNORMAL HIGH (ref 30.0–36.0)
MCV: 81.9 fL (ref 78.0–100.0)
Metamyelocytes Relative: 0 %
Myelocytes: 0 %
Promyelocytes Absolute: 0 %
RDW: 16.1 % — ABNORMAL HIGH (ref 11.5–15.5)
WBC Morphology: INCREASED

## 2013-06-03 LAB — PROTIME-INR: INR: 1.49 (ref 0.00–1.49)

## 2013-06-03 MED ORDER — POTASSIUM CHLORIDE 20 MEQ/15ML (10%) PO LIQD
40.0000 meq | ORAL | Status: AC
  Administered 2013-06-03 (×3): 40 meq
  Filled 2013-06-03 (×3): qty 30

## 2013-06-03 MED ORDER — VITAL AF 1.2 CAL PO LIQD
1000.0000 mL | ORAL | Status: DC
  Administered 2013-06-03 – 2013-06-04 (×2): 1000 mL
  Filled 2013-06-03 (×3): qty 1000

## 2013-06-03 MED ORDER — DEXMEDETOMIDINE HCL IN NACL 400 MCG/100ML IV SOLN
0.4000 ug/kg/h | INTRAVENOUS | Status: DC
  Administered 2013-06-03: 0.19 ug/kg/h via INTRAVENOUS
  Administered 2013-06-04: 0.8 ug/kg/h via INTRAVENOUS
  Administered 2013-06-04: 0.5 ug/kg/h via INTRAVENOUS
  Filled 2013-06-03 (×3): qty 100

## 2013-06-03 NOTE — Progress Notes (Signed)
Subjective: No acute events.  Objective: Vital signs in last 24 hours: Temp:  [94.5 F (34.7 C)-101.2 F (38.4 C)] 97.3 F (36.3 C) (12/22 0500) Pulse Rate:  [30-119] 53 (12/22 0500) Resp:  [17-30] 17 (12/22 0500) BP: (79-145)/(56-78) 142/76 mmHg (12/22 0500) SpO2:  [98 %-100 %] 100 % (12/22 0500) Arterial Line BP: (88-192)/(46-85) 101/55 mmHg (12/21 1915) FiO2 (%):  [40 %-50 %] 50 % (12/22 0444) Weight:  [143 lb 8.3 oz (65.1 kg)-151 lb 14.4 oz (68.9 kg)] 151 lb 14.4 oz (68.9 kg) (12/22 0500) Last BM Date: 05/31/13  Intake/Output from previous day: 12/21 0701 - 12/22 0700 In: 2925.8 [I.V.:2515.8; NG/GT:410] Out: 1225 [Urine:1225] Intake/Output this shift:    General appearance: Intubated and sedated GI: soft, non-tender; bowel sounds normal; no masses,  no organomegaly  Lab Results:  Recent Labs  06/02/13 1512 06/02/13 1912 06/03/13 0312  WBC 10.3 11.1* 12.2*  HGB 10.4* 10.5* 11.4*  HCT 28.7* 28.7* 30.8*  PLT 121* 121* 110*   BMET  Recent Labs  06/01/13 1300 06/02/13 0135 06/03/13 0312  NA 135 139 139  K 4.0 3.5 2.4*  CL 111 111 108  CO2 11* 13* 18*  GLUCOSE 128* 211* 197*  BUN 28* 26* 21  CREATININE 1.31 1.26 0.97  CALCIUM 6.8* 7.4* 7.5*   LFT  Recent Labs  06/03/13 0312  PROT 6.2  ALBUMIN 1.7*  AST 594*  ALT 116*  ALKPHOS 370*  BILITOT 4.7*   PT/INR  Recent Labs  06/02/13 0659 06/03/13 0312  LABPROT 17.3* 17.6*  INR 1.45 1.49   Hepatitis Panel No results found for this basename: HEPBSAG, HCVAB, HEPAIGM, HEPBIGM,  in the last 72 hours C-Diff No results found for this basename: CDIFFTOX,  in the last 72 hours Fecal Lactopherrin No results found for this basename: FECLLACTOFRN,  in the last 72 hours  Studies/Results: Ir Angiogram Visceral Selective  06/01/2013   CLINICAL DATA:  Massive intraperitoneal hemorrhage secondary to active extravasation from a a liver lesion. The patient went into hypovolemic shock and cardiac arrest and  was resuscitated. Emergency consent for hepatic embolization is requested. I am in agreement with the critical care physician.  EXAM: SELECTIVE VISCERAL ARTERIOGRAPHY; IR ULTRASOUND GUIDANCE VASC ACCESS LEFT; TRANSCATHETER THERAPY EMBOLIZATION; ARTERIOGRAPHY  MEDICATIONS AND MEDICAL HISTORY: Ventilated  FLUOROSCOPY TIME:  18 min and 24 seconds.  PROCEDURE: The procedure, risks, benefits, and alternatives were explained to the patient. Questions regarding the procedure were encouraged and answered. The patient understands and consents to the procedure.  The left groin was prepped with Betadine in a sterile fashion, and a sterile drape was applied covering the operative field. A sterile gown and sterile gloves were used for the procedure.  Under sonographic guidance, a micropuncture needle was inserted into the left common femoral artery and removed over a 018 wire which was up sized to a Bentson. A 5 French sheath was inserted. A cobra 2 catheter was advanced over the wire to the upper aorta. The celiac was engaged. Cobra was advanced over the wire into the common hepatic artery. Angiography was performed. This demonstrated active extravasation in the posterior segment of the right lobe of the liver.  The cobra catheter was advanced beyond the gastroduodenal artery origin into the proper hepatic artery and additional angiographic imaging was performed.  After determining the suspect vessel, a micro catheter was advanced through the Cobra catheter and over an 018 glidewire into a sub selective branch in the right lobe of the liver. Gel-Foam slurry  was utilized for embolization. A single 2 x 5 mm coil was deployed. Follow-up angiogram through the micro catheter demonstrates a wedge-shaped hypoperfused segment in the right lobe corresponding to the area of active extravasation.  The micro catheter was then utilized to select the other 2 large branches to the right lobe can repeat angiography confirmed no other source of  active extravasation.  The cobra catheter was removed. The left groin 5 French sheath was flushed and secured in place. INR performed this morning was determined be 2.7. Angiography of the left common femoral artery in preparation for EXOSEAL was performed.  COMPLICATIONS: None  FINDINGS: Angiography through the common hepatic artery delineates anatomy.  Subsequent imaging of the proper hepatic artery demonstrates active extravasation of contrast within the right lobe of the liver corresponding to the abnormality noted on CT.  Subsequent imaging demonstrates both Gel-Foam and coil embolization of the suspect vessel. Repeat angiography demonstrates resolution of the active extravasation.  Subsequent injection of other 3rd order vessels demonstrates no other source of active extravasation.  IMPRESSION: Diagnostic hepatic arterial angiography confirms active extravasation of contrast within the right lobe of the liver. The source vessel was selectively embolize utilizing Gel-Foam slurry and a single platinum coil. This was successful in eliminating active extravasation of bleeding from the liver parenchyma.  : CONTRAST:  100 cc Omnipaque 300   Electronically Signed   By: Maryclare Bean M.D.   On: 06/01/2013 12:52   Ir Transcath/emboliz  06/01/2013   CLINICAL DATA:  Massive intraperitoneal hemorrhage secondary to active extravasation from a a liver lesion. The patient went into hypovolemic shock and cardiac arrest and was resuscitated. Emergency consent for hepatic embolization is requested. I am in agreement with the critical care physician.  EXAM: SELECTIVE VISCERAL ARTERIOGRAPHY; IR ULTRASOUND GUIDANCE VASC ACCESS LEFT; TRANSCATHETER THERAPY EMBOLIZATION; ARTERIOGRAPHY  MEDICATIONS AND MEDICAL HISTORY: Ventilated  FLUOROSCOPY TIME:  18 min and 24 seconds.  PROCEDURE: The procedure, risks, benefits, and alternatives were explained to the patient. Questions regarding the procedure were encouraged and answered. The  patient understands and consents to the procedure.  The left groin was prepped with Betadine in a sterile fashion, and a sterile drape was applied covering the operative field. A sterile gown and sterile gloves were used for the procedure.  Under sonographic guidance, a micropuncture needle was inserted into the left common femoral artery and removed over a 018 wire which was up sized to a Bentson. A 5 French sheath was inserted. A cobra 2 catheter was advanced over the wire to the upper aorta. The celiac was engaged. Cobra was advanced over the wire into the common hepatic artery. Angiography was performed. This demonstrated active extravasation in the posterior segment of the right lobe of the liver.  The cobra catheter was advanced beyond the gastroduodenal artery origin into the proper hepatic artery and additional angiographic imaging was performed.  After determining the suspect vessel, a micro catheter was advanced through the Cobra catheter and over an 018 glidewire into a sub selective branch in the right lobe of the liver. Gel-Foam slurry was utilized for embolization. A single 2 x 5 mm coil was deployed. Follow-up angiogram through the micro catheter demonstrates a wedge-shaped hypoperfused segment in the right lobe corresponding to the area of active extravasation.  The micro catheter was then utilized to select the other 2 large branches to the right lobe can repeat angiography confirmed no other source of active extravasation.  The cobra catheter was removed. The left groin  5 French sheath was flushed and secured in place. INR performed this morning was determined be 2.7. Angiography of the left common femoral artery in preparation for EXOSEAL was performed.  COMPLICATIONS: None  FINDINGS: Angiography through the common hepatic artery delineates anatomy.  Subsequent imaging of the proper hepatic artery demonstrates active extravasation of contrast within the right lobe of the liver corresponding to the  abnormality noted on CT.  Subsequent imaging demonstrates both Gel-Foam and coil embolization of the suspect vessel. Repeat angiography demonstrates resolution of the active extravasation.  Subsequent injection of other 3rd order vessels demonstrates no other source of active extravasation.  IMPRESSION: Diagnostic hepatic arterial angiography confirms active extravasation of contrast within the right lobe of the liver. The source vessel was selectively embolize utilizing Gel-Foam slurry and a single platinum coil. This was successful in eliminating active extravasation of bleeding from the liver parenchyma.  : CONTRAST:  100 cc Omnipaque 300   Electronically Signed   By: Maryclare Bean M.D.   On: 06/01/2013 12:52   Ir Angiogram Follow Up Study  06/01/2013   CLINICAL DATA:  Massive intraperitoneal hemorrhage secondary to active extravasation from a a liver lesion. The patient went into hypovolemic shock and cardiac arrest and was resuscitated. Emergency consent for hepatic embolization is requested. I am in agreement with the critical care physician.  EXAM: SELECTIVE VISCERAL ARTERIOGRAPHY; IR ULTRASOUND GUIDANCE VASC ACCESS LEFT; TRANSCATHETER THERAPY EMBOLIZATION; ARTERIOGRAPHY  MEDICATIONS AND MEDICAL HISTORY: Ventilated  FLUOROSCOPY TIME:  18 min and 24 seconds.  PROCEDURE: The procedure, risks, benefits, and alternatives were explained to the patient. Questions regarding the procedure were encouraged and answered. The patient understands and consents to the procedure.  The left groin was prepped with Betadine in a sterile fashion, and a sterile drape was applied covering the operative field. A sterile gown and sterile gloves were used for the procedure.  Under sonographic guidance, a micropuncture needle was inserted into the left common femoral artery and removed over a 018 wire which was up sized to a Bentson. A 5 French sheath was inserted. A cobra 2 catheter was advanced over the wire to the upper aorta. The  celiac was engaged. Cobra was advanced over the wire into the common hepatic artery. Angiography was performed. This demonstrated active extravasation in the posterior segment of the right lobe of the liver.  The cobra catheter was advanced beyond the gastroduodenal artery origin into the proper hepatic artery and additional angiographic imaging was performed.  After determining the suspect vessel, a micro catheter was advanced through the Cobra catheter and over an 018 glidewire into a sub selective branch in the right lobe of the liver. Gel-Foam slurry was utilized for embolization. A single 2 x 5 mm coil was deployed. Follow-up angiogram through the micro catheter demonstrates a wedge-shaped hypoperfused segment in the right lobe corresponding to the area of active extravasation.  The micro catheter was then utilized to select the other 2 large branches to the right lobe can repeat angiography confirmed no other source of active extravasation.  The cobra catheter was removed. The left groin 5 French sheath was flushed and secured in place. INR performed this morning was determined be 2.7. Angiography of the left common femoral artery in preparation for EXOSEAL was performed.  COMPLICATIONS: None  FINDINGS: Angiography through the common hepatic artery delineates anatomy.  Subsequent imaging of the proper hepatic artery demonstrates active extravasation of contrast within the right lobe of the liver corresponding to the abnormality noted on  CT.  Subsequent imaging demonstrates both Gel-Foam and coil embolization of the suspect vessel. Repeat angiography demonstrates resolution of the active extravasation.  Subsequent injection of other 3rd order vessels demonstrates no other source of active extravasation.  IMPRESSION: Diagnostic hepatic arterial angiography confirms active extravasation of contrast within the right lobe of the liver. The source vessel was selectively embolize utilizing Gel-Foam slurry and a  single platinum coil. This was successful in eliminating active extravasation of bleeding from the liver parenchyma.  : CONTRAST:  100 cc Omnipaque 300   Electronically Signed   By: Maryclare Bean M.D.   On: 06/01/2013 12:52   Ir US Guide Vasc Access Left  06/01/2013   CLINICAL DATA:  Massive intraperitoneal hemorrhage secondary to active extravasation from a a liver lesion. The patient went into hypovolemic shock and cardiac arrest and was resuscitated. Emergency consent for hepatic embolization is requested. I am in agreement with the critical care physician.  EXAM: SELECTIVE VISCERAL ARTERIOGRAPHY; IR ULTRASOUND GUIDANCE VASC ACCESS LEFT; TRANSCATHETER THERAPY EMBOLIZATION; ARTERIOGRAPHY  MEDICATIONS AND MEDICAL HISTORY: Ventilated  FLUOROSCOPY TIME:  18 min and 24 seconds.  PROCEDURE: The procedure, risks, benefits, and alternatives were explained to the patient. Questions regarding the procedure were encouraged and answered. The patient understands and consents to the procedure.  The left groin was prepped with Betadine in a sterile fashion, and a sterile drape was applied covering the operative field. A sterile gown and sterile gloves were used for the procedure.  Under sonographic guidance, a micropuncture needle was inserted into the left common femoral artery and removed over a 018 wire which was up sized to a Bentson. A 5 French sheath was inserted. A cobra 2 catheter was advanced over the wire to the upper aorta. The celiac was engaged. Cobra was advanced over the wire into the common hepatic artery. Angiography was performed. This demonstrated active extravasation in the posterior segment of the right lobe of the liver.  The cobra catheter was advanced beyond the gastroduodenal artery origin into the proper hepatic artery and additional angiographic imaging was performed.  After determining the suspect vessel, a micro catheter was advanced through the Cobra catheter and over an 018 glidewire into a sub  selective branch in the right lobe of the liver. Gel-Foam slurry was utilized for embolization. A single 2 x 5 mm coil was deployed. Follow-up angiogram through the micro catheter demonstrates a wedge-shaped hypoperfused segment in the right lobe corresponding to the area of active extravasation.  The micro catheter was then utilized to select the other 2 large branches to the right lobe can repeat angiography confirmed no other source of active extravasation.  The cobra catheter was removed. The left groin 5 French sheath was flushed and secured in place. INR performed this morning was determined be 2.7. Angiography of the left common femoral artery in preparation for EXOSEAL was performed.  COMPLICATIONS: None  FINDINGS: Angiography through the common hepatic artery delineates anatomy.  Subsequent imaging of the proper hepatic artery demonstrates active extravasation of contrast within the right lobe of the liver corresponding to the abnormality noted on CT.  Subsequent imaging demonstrates both Gel-Foam and coil embolization of the suspect vessel. Repeat angiography demonstrates resolution of the active extravasation.  Subsequent injection of other 3rd order vessels demonstrates no other source of active extravasation.  IMPRESSION: Diagnostic hepatic arterial angiography confirms active extravasation of contrast within the right lobe of the liver. The source vessel was selectively embolize utilizing Gel-Foam slurry and a single platinum  coil. This was successful in eliminating active extravasation of bleeding from the liver parenchyma.  : CONTRAST:  100 cc Omnipaque 300   Electronically Signed   By: Maryclare Bean M.D.   On: 06/01/2013 12:52   Dg Chest Port 1 View  06/02/2013   CLINICAL DATA:  Intubated.  EXAM: PORTABLE CHEST - 1 VIEW  COMPARISON:  06/01/2013  FINDINGS: Endotracheal tube remains in stable position. Low lung volumes with bibasilar atelectasis. Heart is normal size. No visible significant effusions.   IMPRESSION: Low lung volumes, bibasilar atelectasis.   Electronically Signed   By: Charlett Nose M.D.   On: 06/02/2013 07:32   Dg Chest Port 1 View  06/01/2013   CLINICAL DATA:  Evaluate endotracheal tube placement and central line placement.  EXAM: PORTABLE CHEST - 1 VIEW  COMPARISON:  05/30/2013.  FINDINGS: Defibrillator pads overlie the chest. Endotracheal tube is present with the tip 3 cm from the carina. Right IJ vascular sheath is present. There is no pneumothorax. Left basilar atelectasis. Low volumes accentuate the size of the cardiopericardial silhouette and produce tortuosity of the thoracic aorta. Enteric contrast is present in the left upper quadrant.  IMPRESSION: 1. Endotracheal tube and uncomplicated right IJ vascular sheath. Endotracheal tube tip 3 cm from the carina. 2. Left basilar atelectasis.   Electronically Signed   By: Andreas Newport M.D.   On: 06/01/2013 09:43    Medications:  Scheduled: . chlorhexidine  15 mL Mouth/Throat BID  . feeding supplement (VITAL AF 1.2 CAL)  1,000 mL Per Tube Q24H  . folic acid  1 mg Oral Daily  . hydrocortisone sodium succinate  50 mg Intravenous Q6H  . LORazepam  2 mg Intravenous Q8H  . multivitamin with minerals  1 tablet Oral Daily  . pantoprazole (PROTONIX) IV  40 mg Intravenous Q24H  . potassium chloride  40 mEq Per Tube Q4H  . sodium chloride  3 mL Intravenous Q12H  . thiamine  100 mg Oral Daily   Or  . thiamine  100 mg Intravenous Daily   Continuous: . sodium chloride 20 mL/hr at 06/03/13 0600  . sodium chloride    . dexmedetomidine 0.2 mcg/kg/hr (06/03/13 0600)  . dextrose 20 mL/hr at 06/03/13 0600  . fentaNYL infusion INTRAVENOUS Stopped (06/02/13 1800)  .  sodium bicarbonate  infusion 1000 mL 75 mL/hr at 06/03/13 0600    Assessment/Plan: 1) Hepatic arterial bleed. 2) ETOH withdrawal.   I reviewed the weekend events.  At this point I do not know if any further work up for his malignancy will be beneficial.  No further GI  intervention necessary.  Plan: 1) Continue supportive care. 2) Call with any questions. 3) Signing off.   LOS: 4 days   Tiberius Loftus D 06/03/2013, 7:27 AM

## 2013-06-03 NOTE — Progress Notes (Signed)
NUTRITION FOLLOW UP  DOCUMENTATION CODES  Per approved criteria   -Severe malnutrition in the context of chronic illness  -Underweight    Intervention:    Continue TF via OGT with Vital AF 1.2 at 40 ml/h, increase by 10 ml every 4 hours to goal rate of 50 ml/h to provide 1440 kcals, 90 gm protein, 973 ml free water daily.  Nutrition Dx:   Increased nutrient needs related to alcoholism, catabolic illness as evidenced by estimated nutrition needs; ongoing.  Goal:   Intake to meet >90% of estimated nutrition needs. Unmet.  Monitor:   TF tolerance/adequacy, weight trend, labs, vent status.  Assessment:   Patient with PMH of cholelithiasis & ETOH abuse (3-4 40 oz daily) who presented to [facility] ED with complaints of "sore", intermittent, worsening, non-radiating RUQ abdominal pain. 12/18 [place] of abdomen showed liver masses with metastases.   Patient meets criteria for severe malnutrition in the context of chronic illness as evidenced by < 75% intake of estimated energy requirement for > 1 month and 13% weight loss x 6 months.  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  Developed hemorrhagic shock from active perihepatic hemorrhage from subcapsular lesion. S/P code blue from VT arrest on 12/20; required intubation and transfer to the ICU. Received MD Consult for TF initiation and management. Vital AF 1.2 has been initiated, currently infusing at 40 ml/h, providing 1152 kcals, 72 gm protein, 779 ml free water daily.   Patient is currently intubated on ventilator support.  MV: 7.9 L/min Temp (24hrs), Avg:97.4 F (36.3 C), Min:94.5 F (34.7 C), Max:99.6 F (37.6 C)   Height: Ht Readings from Last 1 Encounters:  05/30/13 5\' 6"  (1.676 m)    Weight Status:  Increased with positive fluid status. Wt Readings from Last 1 Encounters:  06/03/13 151 lb 14.4 oz (68.9 kg)  05/31/13  104 lb 4.4 oz (47.3 kg)   Re-estimated needs:   Kcal: 1500 Protein: 80-100 gm Fluid: 1.5 L  Skin: skin tear on left wrist  Diet Order: NPO   Intake/Output Summary (Last 24 hours) at 06/03/13 1103 Last data filed at 06/03/13 0900  Gross per 24 hour  Intake 3180.8 ml  Output   1000 ml  Net 2180.8 ml    Last BM: 12/19   Labs:   Recent Labs Lab 05/30/13 0105  05/31/13 0455  06/01/13 1300 06/02/13 0135 06/03/13 0312  NA 133*  < > 130*  < > 135 139 139  K 5.9*  < > 5.1  < > 4.0 3.5 2.4*  CL 102  < > 101  < > 111 111 108  CO2 21  < > 18*  < > 11* 13* 18*  BUN 30*  < > 30*  < > 28* 26* 21  CREATININE 1.47*  < > 1.43*  < > 1.31 1.26 0.97  CALCIUM 8.4  < > 8.1*  < > 6.8* 7.4* 7.5*  MG 2.2  --   --   --   --   --   --   PHOS 5.5*  --  5.1*  --   --   --   --   GLUCOSE 97  < > 83  < > 128* 211* 197*  < > = values in this interval not displayed.  CBG (last 3)   Recent Labs  06/02/13 2358 06/03/13 0404 06/03/13 0800  GLUCAP 161* 184* 164*    Scheduled Meds: .  chlorhexidine  15 mL Mouth/Throat BID  . feeding supplement (VITAL AF 1.2 CAL)  1,000 mL Per Tube Q24H  . folic acid  1 mg Oral Daily  . hydrocortisone sodium succinate  50 mg Intravenous Q6H  . LORazepam  2 mg Intravenous Q8H  . multivitamin with minerals  1 tablet Oral Daily  . pantoprazole (PROTONIX) IV  40 mg Intravenous Q24H  . potassium chloride  40 mEq Per Tube Q4H  . sodium chloride  3 mL Intravenous Q12H  . thiamine  100 mg Oral Daily   Or  . thiamine  100 mg Intravenous Daily    Continuous Infusions: . sodium chloride 20 mL/hr at 06/03/13 0600  . sodium chloride    . dexmedetomidine Stopped (06/03/13 1054)  . dextrose Stopped (06/03/13 0700)  . fentaNYL infusion INTRAVENOUS Stopped (06/02/13 1800)  .  sodium bicarbonate  infusion 1000 mL 75 mL/hr at 06/03/13 0600    Joaquin Courts, RD, LDN, CNSC Pager [phone] After Hours Pager [phone]

## 2013-06-03 NOTE — Significant Event (Signed)
ABG    Component Value Date/Time   PHART 7.637* 06/03/2013 0421   PCO2ART 17.6* 06/03/2013 0421   PO2ART 55.7* 06/03/2013 0421   HCO3 19.2* 06/03/2013 0421   TCO2 19.8 06/03/2013 0421   ACIDBASEDEF 2.1* 06/03/2013 0421   O2SAT 94.0 06/03/2013 0421     Vent set rate 30 and pt breathing 30.  Will decrease RR to 16 and f/u ABG.  Coralyn Helling, MD 06/03/2013, 4:39 AM

## 2013-06-03 NOTE — Progress Notes (Signed)
[facility] ADULT ICU REPLACEMENT PROTOCOL FOR AM LAB REPLACEMENT ONLY  The patient does apply for the [facility] Adult ICU Electrolyte Replacment Protocol based on the criteria listed below:   1. Is GFR >/= 40 ml/min? yes  Patient's GFR today is >90 2. Is urine output >/= 0.5 ml/kg/hr for the last 6 hours? yes Patient's UOP is 1.15 ml/kg/hr 3. Is BUN < 60 mg/dL? yes  Patient's BUN today is 21 4. Abnormal electrolyte(s):K 2.4 5. Ordered repletion with: per protocol 6. If a panic level lab has been reported, has the CCM MD in charge been notified? yes.   Physician:  Dr Keene Breath 06/03/2013 5:54 AM

## 2013-06-03 NOTE — Progress Notes (Signed)
Critical value called to elink. Potasium 2.4. Orders to follow.

## 2013-06-04 ENCOUNTER — Inpatient Hospital Stay (HOSPITAL_COMMUNITY): Payer: Medicaid Other

## 2013-06-04 LAB — CBC
MCH: 29.9 pg (ref 26.0–34.0)
MCHC: 35.3 g/dL (ref 30.0–36.0)
MCV: 84.8 fL (ref 78.0–100.0)
Platelets: 115 10*3/uL — ABNORMAL LOW (ref 150–400)
RBC: 3.88 MIL/uL — ABNORMAL LOW (ref 4.22–5.81)

## 2013-06-04 LAB — BASIC METABOLIC PANEL
BUN: 25 mg/dL — ABNORMAL HIGH (ref 6–23)
CO2: 23 mEq/L (ref 19–32)
Calcium: 7.4 mg/dL — ABNORMAL LOW (ref 8.4–10.5)
Chloride: 109 mEq/L (ref 96–112)
Creatinine, Ser: 0.97 mg/dL (ref 0.50–1.35)
Glucose, Bld: 159 mg/dL — ABNORMAL HIGH (ref 70–99)
Sodium: 143 mEq/L (ref 135–145)

## 2013-06-04 LAB — GLUCOSE, CAPILLARY
Glucose-Capillary: 139 mg/dL — ABNORMAL HIGH (ref 70–99)
Glucose-Capillary: 183 mg/dL — ABNORMAL HIGH (ref 70–99)
Glucose-Capillary: 175 mg/dL — ABNORMAL HIGH (ref 70–99)

## 2013-06-04 MED ORDER — THIAMINE HCL 100 MG/ML IJ SOLN
100.0000 mg | Freq: Every day | INTRAMUSCULAR | Status: DC
  Administered 2013-06-04 – 2013-06-05 (×2): 100 mg via INTRAVENOUS
  Filled 2013-06-04 (×4): qty 1

## 2013-06-04 MED ORDER — POTASSIUM CHLORIDE 10 MEQ/50ML IV SOLN
10.0000 meq | INTRAVENOUS | Status: AC
  Administered 2013-06-04 (×6): 10 meq via INTRAVENOUS
  Filled 2013-06-04: qty 50

## 2013-06-04 MED ORDER — HYDROCORTISONE SOD SUCCINATE 100 MG IJ SOLR
50.0000 mg | Freq: Two times a day (BID) | INTRAMUSCULAR | Status: DC
  Administered 2013-06-05 – 2013-06-06 (×3): 50 mg via INTRAVENOUS
  Filled 2013-06-04 (×5): qty 1

## 2013-06-04 MED ORDER — HYDRALAZINE HCL 20 MG/ML IJ SOLN
5.0000 mg | Freq: Four times a day (QID) | INTRAMUSCULAR | Status: DC | PRN
  Administered 2013-06-04: 5 mg via INTRAVENOUS
  Filled 2013-06-04: qty 1

## 2013-06-04 MED ORDER — CHLORHEXIDINE GLUCONATE 0.12 % MT SOLN
15.0000 mL | Freq: Two times a day (BID) | OROMUCOSAL | Status: DC
  Administered 2013-06-04 – 2013-06-05 (×2): 15 mL via OROMUCOSAL
  Filled 2013-06-04: qty 15

## 2013-06-04 MED ORDER — INSULIN ASPART 100 UNIT/ML ~~LOC~~ SOLN
0.0000 [IU] | SUBCUTANEOUS | Status: DC
  Administered 2013-06-04 – 2013-06-05 (×3): 2 [IU] via SUBCUTANEOUS

## 2013-06-04 MED ORDER — BIOTENE DRY MOUTH MT LIQD
15.0000 mL | Freq: Four times a day (QID) | OROMUCOSAL | Status: DC
  Administered 2013-06-04 – 2013-06-05 (×4): 15 mL via OROMUCOSAL

## 2013-06-04 NOTE — Progress Notes (Addendum)
PULMONARY / CRITICAL CARE MEDICINE  Name: Micheal Daniel MRN: [id number] DOB: 06/13/1956    ADMISSION DATE:  05/30/2013 CONSULTATION DATE:  06/01/13  REFERRING MD :  Danise Edge  PRIMARY SERVICE: PCCM   CHIEF COMPLAINT:  Shock   BRIEF PATIENT DESCRIPTION: 57 yo with h/o ETOH and Hep C admitted 12/19 for abd pain. Found to have liver mass. Developed hemorrhagic shock related to perihepatic hemorrhage from subcapsular lesion. Suffered VT arrest 12/20, transferred to ICU.  SIGNIFICANT EVENTS / STUDIES:  12/19  Admitted with abdominal pain, liver mass 12/19  EGD >>> NO esophageal mass 12/20  Perihepatic hemorrhage, VT arrest x 4 minutes, transfused PRBC x 3 12/20 CT abdomen >>> Suspected active perihepatic hemorrhage of a subcapsular lesion in the anterior segment right hepatic lobe. Associated moderate perihepatic hemorrhage along the right hepatic lobe. Mucosal irregularity/eccentric wall thickening involving the distal esophagus just above the GE junction, worrisome for primary esophageal neoplasm.  Innumerable hepatic metastases, as described above. Suspected lower paraesophageal and upper abdominal nodal metastases. Moderate abdominopelvic ascites. 12/20  IR for emergent hepatic/messenteric embolization  12/22  Goals of care meeting >>> no consensus 12/23  Goals of care meeting >>>  LINES / TUBES: R IJ CVL 12/20 >>> R fem A - line 12/20 >>> 12/23 OETT 12/20 >>> 12/23 OGT 12/20 >>> 12/23 Foley 12/20 >>>  CULTURES:  ANTIBIOTICS:  SUBJECTIVE:  Tolerating SBT well. Hypertensive overnight.  VITAL SIGNS: Temp:  [98.1 F (36.7 C)-101.1 F (38.4 C)] 98.4 F (36.9 C) (12/23 1216) Pulse Rate:  [56-94] 79 (12/23 1350) Resp:  [21-42] 28 (12/23 1350) BP: (96-156)/(64-89) 153/76 mmHg (12/23 1100) SpO2:  [91 %-99 %] 97 % (12/23 1350) Arterial Line BP: (105-174)/(56-84) 160/73 mmHg (12/23 1100) FiO2 (%):  [30 %-40 %] 40 % (12/23 1330)  HEMODYNAMICS: CVP:  [13 mmHg-22 mmHg] 15  mmHg  VENTILATOR SETTINGS: Vent Mode:  [-] CPAP;PSV FiO2 (%):  [30 %-40 %] 40 % Set Rate:  [16 bmp] 16 bmp Vt Set:  [500 mL] 500 mL PEEP:  [5 cmH20] 5 cmH20 Pressure Support:  [10 cmH20] 10 cmH20 Plateau Pressure:  [24 cmH20-36 cmH20] 24 cmH20 INTAKE / OUTPUT: Intake/Output     12/22 0701 - 12/23 0700 12/23 0701 - 12/24 0700   I.V. (mL/kg) 2428.4 (35.2) 488.4 (7.1)   NG/GT 1301.5 330   Total Intake(mL/kg) 3729.9 (54.1) 818.4 (11.9)   Urine (mL/kg/hr) 995 (0.6) 400 (0.8)   Total Output 995 400   Net +2734.9 +418.4          PHYSICAL EXAMINATION: General:  Tolerating SBT well Neuro:  Attempting to communicate HEENT: OETT / OGT Cardiovascular:  Regular, no murmurs Lungs:  Bilateral rhonchi Abdomen:  Distended, non tender, bowel sounds diminished Musculoskeletal: Moves all extremities Skin:  No rash   LABS:  CBC  Recent Labs Lab 06/02/13 1912 06/03/13 0312 06/04/13 0445  WBC 11.1* 12.2* 12.5*  HGB 10.5* 11.4* 11.6*  HCT 28.7* 30.8* 32.9*  PLT 121* 110* 115*   Coag's  Recent Labs Lab 06/01/13 1026 06/02/13 0659 06/03/13 0312  APTT 34 34 35  INR 2.27* 1.45 1.49   BMET  Recent Labs Lab 06/03/13 0312 06/03/13 2000 06/04/13 0445  NA 139 143 143  K 2.4* 3.7 3.0*  CL 108 110 109  CO2 18* 21 23  BUN 21 26* 25*  CREATININE 0.97 0.98 0.97  GLUCOSE 197* 187* 159*   Electrolytes  Recent Labs Lab 05/30/13 0105  05/31/13 0455  06/03/13 0312 06/03/13 2000 06/04/13 0445  CALCIUM 8.4  < > 8.1*  < > 7.5* 7.3* 7.4*  MG 2.2  --   --   --   --   --   --   PHOS 5.5*  --  5.1*  --   --   --   --   < > = values in this interval not displayed. Sepsis Markers  Recent Labs Lab 05/30/13 1350 06/01/13 1030  LATICACIDVEN 5.1* 9.3*   ABG  Recent Labs Lab 06/02/13 0936 06/03/13 0421 06/03/13 0640  PHART 7.361 7.637* 7.421  PCO2ART 24.9* 17.6* 30.7*  PO2ART 93.0 55.7* 124.0*   Liver Enzymes  Recent Labs Lab 06/01/13 1030 06/02/13 0135  06/03/13 0312  AST 1010* 1011* 594*  ALT 168* 167* 116*  ALKPHOS 377* 366* 370*  BILITOT 1.7* 3.7* 4.7*  ALBUMIN 1.1* 2.1* 1.7*   Cardiac Enzymes  Recent Labs Lab 06/01/13 1745  TROPONINI <0.30   Glucose  Recent Labs Lab 06/03/13 1634 06/03/13 1956 06/04/13 0102 06/04/13 0349 06/04/13 0804 06/04/13 1212  GLUCAP 152* 171* 169* 139* 183* 175*   CXR: 12/23 >>> Low ETT, pulmonary vascular congestion, effusions  ASSESSMENT / PLAN:  PULMONARY A: Acute respiratory failure in setting of cardiac arrest P:   SpO2>92 Supplemental oxygen PRN Extubate  CARDIOVASCULAR A: Hemorrhagic shock, resolved. VT arrest in setting of acute hemorrhage. Hypertension. P:  Goal MAP>65 Hydralazine PRN  RENAL A:  Acute renal failure in setting of shock / cardiac arrest. Hypokalemia. P:   Trend BMP D/c Bicarbonate gtt K 10 x 6  GASTROINTESTINAL A:  Perihepatic hemorrhage from subcapsular lesion, s/p embolization. Numerous liver mass lesions (HCC? Mx?). Elevated liver enzymes in setting of liver mass lesions / shock. H/o Hep C.  Suspected liver cirrhosis. P:   NPO D/c TF Protonix for GI Px  HEMATOLOGIC A:  Anemia of acute blood loss.  Coagulopathy ( liver failure? DIC? ) - resolved. P:  Trend CBC Trend APTT / INR SCD  INFECTIOUS A:  No active issues. P:   No intervention required  ENDOCRINE A:  Hypoglycemia initially, now hyperglycemic. Suspected adrenal insufficiency (cortisol 16 in presence of shock). P:   Add SSI Decrease Hydrocortisone to q12h  NEUROLOGIC A:  Acute encephalopathy.  Possible anoxia.  At risk for ETOH withdrawal. Possible anoxia. P:   D/c Fentanyl gtt D/c Precedex gtt Folate / Thiamin to IV  I have personally obtained history, examined patient, evaluated and interpreted laboratory and imaging results, reviewed medical records, formulated assessment / plan and placed orders.  CRITICAL CARE:  The patient is critically ill with multiple organ  systems failure and requires high complexity decision making for assessment and support, frequent evaluation and titration of therapies, application of advanced monitoring technologies and extensive interpretation of multiple databases. Critical Care Time devoted to patient care services described in this note is 35 minutes.   Lonia Farber, MD Pulmonary and Critical Care Medicine [facility] Pager: [phone]  06/04/2013, 2:09 PM

## 2013-06-04 NOTE — Procedures (Signed)
Extubation Procedure Note  Patient Details:   Name: Micheal Daniel DOB: 1955-10-25 MRN: [id number]   Airway Documentation:     Evaluation  O2 sats: stable throughout Complications: No apparent complications Patient did tolerate procedure well. Bilateral Breath Sounds: Other (Comment) (coarse) Suctioning: Oral;Airway Yes  Pt tolerated wean, positive for cuff leak, extubated to 4lpm [place]. No stridor or dyspnea noted after extubation. Pt oriented to name and place. All vitals are within normal limits. RT will continue to monitor.   Beatris Si 06/04/2013, 1:58 PM

## 2013-06-04 NOTE — Progress Notes (Signed)
Extensive discussion with family and the patient. We discussed the poor prognosis and likely poor quality of life. Consensus is to limit heroic interventions and institute DNR/DNI status.   Lonia Farber, MD Pulmonary and Critical Care Medicine [facility] Pager: [phone]

## 2013-06-05 LAB — GLUCOSE, CAPILLARY
Glucose-Capillary: 113 mg/dL — ABNORMAL HIGH (ref 70–99)
Glucose-Capillary: 147 mg/dL — ABNORMAL HIGH (ref 70–99)
Glucose-Capillary: 83 mg/dL (ref 70–99)
Glucose-Capillary: 93 mg/dL (ref 70–99)

## 2013-06-05 LAB — BASIC METABOLIC PANEL
BUN: 26 mg/dL — ABNORMAL HIGH (ref 6–23)
Chloride: 112 mEq/L (ref 96–112)
GFR calc Af Amer: >90 mL/min (ref >90)
Glucose, Bld: 89 mg/dL (ref 70–99)
Potassium: 3.2 mEq/L — ABNORMAL LOW (ref 3.5–5.1)

## 2013-06-05 MED ORDER — POTASSIUM CHLORIDE 10 MEQ/50ML IV SOLN
10.0000 meq | INTRAVENOUS | Status: AC
  Administered 2013-06-05 (×4): 10 meq via INTRAVENOUS
  Filled 2013-06-05 (×4): qty 50

## 2013-06-05 MED ORDER — WHITE PETROLATUM GEL
Status: AC
  Filled 2013-06-05: qty 5

## 2013-06-05 MED ORDER — DIPHENHYDRAMINE HCL 25 MG PO CAPS
25.0000 mg | ORAL_CAPSULE | Freq: Every evening | ORAL | Status: DC | PRN
  Administered 2013-06-05: 25 mg via ORAL
  Filled 2013-06-05: qty 1

## 2013-06-05 NOTE — Progress Notes (Signed)
eLink Physician-Brief Progress Note Patient Name: Micheal Daniel DOB: Feb 23, 1956 MRN: [id number]  Date of Service  06/05/2013   HPI/Events of Note  Insomnia  eICU Interventions  PRN for benadryl 25 mg po qhs - may repeat times one for insomnia   Intervention Category Minor Interventions: Routine modifications to care plan (e.g. PRN medications for pain, fever)  Paton Crum 06/05/2013, 11:18 PM

## 2013-06-05 NOTE — Progress Notes (Signed)
Thank you for consulting the Palliative Medicine Team at [facility] to meet your patient's and family's needs.   The reason that you asked [place] to see your patient is  For GOc affirmation, hospice referal  We have scheduled your patient for a meeting: Friday at 2pm with his sister will check on him in the am.  The Surrogate decision make is: Dois Davenport , sister Contact information: [phone]  Other family members that need to be present: at their discretion    Your patient is able/unable to participate:able  Additional Narrative: Will check in with patient in the Am and meet with family as above.   Gala Padovano L. Ladona Ridgel, MD MBA The Palliative Medicine Team at [facility] Phone: [phone] Pager: [phone]

## 2013-06-05 NOTE — Progress Notes (Signed)
Patient ZO:XWRUEAVW [place]      DOB: 1956-05-09      UJW:[id number]  Consult received from Dr. Herma Carson.  Spoke with patient .  He would like me to schedule a time with his sister Dois Davenport.  I have left a message to schedule at time with her.  Await return call.   Kalijah Zeiss L. Ladona Ridgel, MD MBA The Palliative Medicine Team at [facility] Phone: [phone] Pager: [phone]

## 2013-06-05 NOTE — Progress Notes (Signed)
PULMONARY / CRITICAL CARE MEDICINE  Name: Micheal Daniel MRN: [id number] DOB: 12-07-1955    ADMISSION DATE:  05/30/2013 CONSULTATION DATE:  06/01/13  REFERRING MD :  Danise Edge  PRIMARY SERVICE: PCCM   CHIEF COMPLAINT:  Shock   BRIEF PATIENT DESCRIPTION: 57 yo with h/o ETOH and Hep C admitted 12/19 for abd pain. Found to have liver mass. Developed hemorrhagic shock related to perihepatic hemorrhage from subcapsular lesion. Suffered VT arrest 12/20, transferred to ICU.  SIGNIFICANT EVENTS / STUDIES:  12/19  Admitted with abdominal pain, liver mass 12/19  EGD >>> NO esophageal mass 12/20  Perihepatic hemorrhage, VT arrest x 4 minutes, transfused PRBC x 3 12/20 CT abdomen >>> Suspected active perihepatic hemorrhage of a subcapsular lesion in the anterior segment right hepatic lobe. Associated moderate perihepatic hemorrhage along the right hepatic lobe. Mucosal irregularity/eccentric wall thickening involving the distal esophagus just above the GE junction, worrisome for primary esophageal neoplasm.  Innumerable hepatic metastases, as described above. Suspected lower paraesophageal and upper abdominal nodal metastases. Moderate abdominopelvic ascites. 12/20  IR for emergent hepatic/messenteric embolization  12/22  Goals of care meeting >>> no consensus 12/23  Goals of care meeting >>> DNI / DNR  LINES / TUBES: R IJ CVL 12/20 >>> R fem A - line 12/20 >>> 12/23 OETT 12/20 >>> 12/23 OGT 12/20 >>> 12/23 Foley 12/20 >>>  CULTURES:  ANTIBIOTICS:  SUBJECTIVE:  No overnight issues.  VITAL SIGNS: Temp:  [97.8 F (36.6 C)-98.4 F (36.9 C)] 98.3 F (36.8 C) (12/24 0758) Pulse Rate:  [58-101] 87 (12/24 0915) Resp:  [16-31] 29 (12/24 0915) BP: (113-161)/(68-112) 150/87 mmHg (12/24 0915) SpO2:  [94 %-100 %] 97 % (12/24 0915) Arterial Line BP: (124-167)/(57-78) 145/72 mmHg (12/23 1700) FiO2 (%):  [40 %] 40 % (12/23 1330)  HEMODYNAMICS:    VENTILATOR SETTINGS: Vent Mode:  [-] CPAP;PSV FiO2  (%):  [40 %] 40 % PEEP:  [5 cmH20] 5 cmH20 Pressure Support:  [10 cmH20] 10 cmH20 INTAKE / OUTPUT: Intake/Output     12/23 0701 - 12/24 0700 12/24 0701 - 12/25 0700   I.V. (mL/kg) 828.4 (12)    NG/GT 330    IV Piggyback 250 100   Total Intake(mL/kg) 1408.4 (20.4) 100 (1.5)   Urine (mL/kg/hr) 1375 (0.8)    Stool  100 (0.3)   Total Output 1375 100   Net +33.4 0        Stool Occurrence 3 x      PHYSICAL EXAMINATION: General:  No distress, comfortable Neuro:  Sleepy but arouses to stimulation HEENT: PERRL Cardiovascular:  Regular, no murmurs Lungs:  Bilateral diminished air entry Abdomen:  Distended, non tender, bowel sounds diminished Musculoskeletal: Moves all extremities Skin:  No rash   LABS:  CBC  Recent Labs Lab 06/02/13 1912 06/03/13 0312 06/04/13 0445  WBC 11.1* 12.2* 12.5*  HGB 10.5* 11.4* 11.6*  HCT 28.7* 30.8* 32.9*  PLT 121* 110* 115*   Coag's  Recent Labs Lab 06/01/13 1026 06/02/13 0659 06/03/13 0312  APTT 34 34 35  INR 2.27* 1.45 1.49   BMET  Recent Labs Lab 06/03/13 2000 06/04/13 0445 06/05/13 0500  NA 143 143 148*  K 3.7 3.0* 3.2*  CL 110 109 112  CO2 21 23 25   BUN 26* 25* 26*  CREATININE 0.98 0.97 0.87  GLUCOSE 187* 159* 89   Electrolytes  Recent Labs Lab 05/30/13 0105  05/31/13 0455  06/03/13 2000 06/04/13 0445 06/05/13 0500  CALCIUM 8.4  < > 8.1*  < >  7.3* 7.4* 7.6*  MG 2.2  --   --   --   --   --   --   PHOS 5.5*  --  5.1*  --   --   --   --   < > = values in this interval not displayed. Sepsis Markers  Recent Labs Lab 05/30/13 1350 06/01/13 1030  LATICACIDVEN 5.1* 9.3*   ABG  Recent Labs Lab 06/02/13 0936 06/03/13 0421 06/03/13 0640  PHART 7.361 7.637* 7.421  PCO2ART 24.9* 17.6* 30.7*  PO2ART 93.0 55.7* 124.0*   Liver Enzymes  Recent Labs Lab 06/01/13 1030 06/02/13 0135 06/03/13 0312  AST 1010* 1011* 594*  ALT 168* 167* 116*  ALKPHOS 377* 366* 370*  BILITOT 1.7* 3.7* 4.7*  ALBUMIN 1.1* 2.1*  1.7*   Cardiac Enzymes  Recent Labs Lab 06/01/13 1745  TROPONINI <0.30   Glucose  Recent Labs Lab 06/04/13 1212 06/04/13 1638 06/04/13 1936 06/04/13 2353 06/05/13 0339 06/05/13 0757  GLUCAP 175* 129* 76 70 83 93   CXR:  None today.  ASSESSMENT / PLAN:  PULMONARY A: Acute respiratory failure in setting of cardiac arrest - resolved. P:   SpO2>92 Supplemental oxygen PRN DNR  CARDIOVASCULAR A: Hemorrhagic shock, resolved. VT arrest in setting of acute hemorrhage. Hypertension. P:  Goal MAP>65 Hydralazine PRN DNR  RENAL A:  Acute renal failure in setting of shock / cardiac arrest. Hypokalemia. P:   Trend BMP K 10 x 4  GASTROINTESTINAL A:  Perihepatic hemorrhage from subcapsular lesion, s/p embolization. Numerous liver mass lesions (HCC? Mx?). Elevated liver enzymes in setting of liver mass lesions / shock. H/o Hep C.  Suspected liver cirrhosis. Ileus. P:   NPO D/c NGT D/c Protonix Diet  HEMATOLOGIC A:  Anemia of acute blood loss.  Coagulopathy ( liver failure? DIC? ) - resolved. P:  Trend CBC Trend APTT / INR SCD  INFECTIOUS A:  No active issues. P:   No intervention required  ENDOCRINE A:  Hypoglycemia initially, now hyperglycemic. Suspected adrenal insufficiency (cortisol 16 in presence of shock). P:   SSI Decrease Hydrocortisone to q12h  NEUROLOGIC A:  Acute encephalopathy.  Possible anoxia.  At risk for ETOH withdrawal. Possible anoxia. P:    Transfer to medical floor. IMTS to assume care 12/25. DNI/DNR. Would suggest hospice evaluation. Palliative Care consulted.  I have personally obtained history, examined patient, evaluated and interpreted laboratory and imaging results, reviewed medical records, formulated assessment / plan and placed orders.  Lonia Farber, MD Pulmonary and Critical Care Medicine [facility] Pager: [phone]  06/05/2013, 11:10 AM

## 2013-06-05 NOTE — Progress Notes (Signed)
eLink Nursing ICU Electrolyte Replacement Protocol  Patient Name: Micheal Daniel DOB: 31-May-1956 MRN: [id number]  Date of Service  06/05/2013   HPI/Events of Note    Recent Labs Lab 05/30/13 0105  05/31/13 0455  06/02/13 0135 06/03/13 0312 06/03/13 2000 06/04/13 0445 06/05/13 0500  NA 133*  < > 130*  < > 139 139 143 143 148*  K 5.9*  < > 5.1  < > 3.5 2.4* 3.7 3.0* 3.2*  CL 102  < > 101  < > 111 108 110 109 112  CO2 21  < > 18*  < > 13* 18* 21 23 25   GLUCOSE 97  < > 83  < > 211* 197* 187* 159* 89  BUN 30*  < > 30*  < > 26* 21 26* 25* 26*  CREATININE 1.47*  < > 1.43*  < > 1.26 0.97 0.98 0.97 0.87  CALCIUM 8.4  < > 8.1*  < > 7.4* 7.5* 7.3* 7.4* 7.6*  MG 2.2  --   --   --   --   --   --   --   --   PHOS 5.5*  --  5.1*  --   --   --   --   --   --   < > = values in this interval not displayed.  Estimated Creatinine Clearance: 83.5 ml/min (by C-G formula based on Cr of 0.87).  Intake/Output     12/23 0701 - 12/24 0700   I.V. (mL/kg) 808.4 (11.7)   NG/GT 330   IV Piggyback 250   Total Intake(mL/kg) 1388.4 (20.2)   Urine (mL/kg/hr) 1375 (0.8)   Total Output 1375   Net +13.4       Stool Occurrence 3 x    - I/O DETAILED x24h    Total I/O In: 320 [I.V.:220; IV Piggyback:100] Out: 575 [Urine:575] - I/O THIS SHIFT    ASSESSMENT   eICURN Interventions   ICU Electrolyte Replacement Protocol criteria met.Labs Replaced per protocol.MD notified    ASSESSMENT: MAJOR ELECTROLYTE    Merita Norton 06/05/2013, 6:51 AM

## 2013-06-06 LAB — GLUCOSE, CAPILLARY
Glucose-Capillary: 99 mg/dL (ref 70–99)
Glucose-Capillary: 107 mg/dL — ABNORMAL HIGH (ref 70–99)
Glucose-Capillary: 105 mg/dL — ABNORMAL HIGH (ref 70–99)
Glucose-Capillary: 131 mg/dL — ABNORMAL HIGH (ref 70–99)
Glucose-Capillary: 111 mg/dL — ABNORMAL HIGH (ref 70–99)

## 2013-06-06 LAB — BASIC METABOLIC PANEL
BUN: 28 mg/dL — ABNORMAL HIGH (ref 6–23)
GFR calc Af Amer: >90 mL/min (ref >90)
GFR calc non Af Amer: >90 mL/min (ref >90)
Glucose, Bld: 98 mg/dL (ref 70–99)
Potassium: 3.8 mEq/L (ref 3.5–5.1)

## 2013-06-06 LAB — CBC
MCH: 30.2 pg (ref 26.0–34.0)
MCHC: 34.2 g/dL (ref 30.0–36.0)
Platelets: 123 10*3/uL — ABNORMAL LOW (ref 150–400)
RBC: 3.94 MIL/uL — ABNORMAL LOW (ref 4.22–5.81)
RDW: 17.5 % — ABNORMAL HIGH (ref 11.5–15.5)

## 2013-06-06 MED ORDER — HYDROCORTISONE 10 MG PO TABS
50.0000 mg | ORAL_TABLET | Freq: Every day | ORAL | Status: AC
  Administered 2013-06-06: 15:00:00 50 mg via ORAL
  Filled 2013-06-06: qty 1

## 2013-06-06 MED ORDER — HYDROCORTISONE 20 MG PO TABS
40.0000 mg | ORAL_TABLET | Freq: Two times a day (BID) | ORAL | Status: AC
  Administered 2013-06-07 (×2): 40 mg via ORAL
  Filled 2013-06-06 (×2): qty 2

## 2013-06-06 MED ORDER — INSULIN ASPART 100 UNIT/ML ~~LOC~~ SOLN
0.0000 [IU] | Freq: Three times a day (TID) | SUBCUTANEOUS | Status: DC
  Administered 2013-06-06: 1 [IU] via SUBCUTANEOUS

## 2013-06-06 MED ORDER — VITAMIN B-1 100 MG PO TABS
100.0000 mg | ORAL_TABLET | Freq: Every day | ORAL | Status: DC
  Administered 2013-06-06 – 2013-06-17 (×12): 100 mg via ORAL
  Filled 2013-06-06 (×12): qty 1

## 2013-06-06 MED ORDER — ADULT MULTIVITAMIN W/MINERALS CH
1.0000 | ORAL_TABLET | Freq: Every day | ORAL | Status: DC
  Administered 2013-06-06 – 2013-06-14 (×9): 1 via ORAL
  Filled 2013-06-06 (×9): qty 1

## 2013-06-06 NOTE — Progress Notes (Signed)
Internal Medicine Attending  Date: 06/06/2013  Patient name: Micheal Daniel Medical record number: [id number] Date of birth: [birth year] Age: 56 y.o. Gender: male  I saw and evaluated the patient, review chart, and discussed his care with house staff.  Patient was admitted to our service on 05/30/2013 with right upper quadrant pain, and ultrasound showed multiple hepatic masses consistent with metastatic disease.  A CT scan of the abdomen and pelvis showed mucosal irregularity/eccentric wall thickening involving the distal esophagus just above the GE junction, worrisome for primary esophageal neoplasm, innumerable hepatic metastases, and suspected lower paraesophageal and upper abdominal nodal metastases; on review, the scan also showed evidence of hemorrhage.  EGD on 12/19 by Dr. Elnoria Howard showed no malignant lesions noted in the gastric and duodenal mucosa.  On 12/20 patient had a cardiac arrest due to acute hemorrhage and was resuscitated and transferred to the critical care service; interventional radiology performed a successful embolization of the arterial source.  Patient was extubated on 12/23, and following a discussion with patient and family by critical care, his code status was revised to DNR.  Critical care requested a palliative care consult, and transferred patient to our service.    Patient is currently alert, and oriented, in without acute complaints other than swelling of his scrotum.  He denies abdominal pain, shortness of breath, chest pain, or dizziness.  Exam shows clear lungs; regular rhythm; soft nontender abdomen; scrotal edema; 2+ bilateral leg edema.  Labs are notable for WBC 19.3, hemoglobin 11.9, platelets 123; sodium 143, BUN 28, creatinine 0.85.  An AFP level done on 12/23 was 670.    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  We are not able to do an ultrasound-guided liver biopsy; EGD was unrevealing.  Although there is uncertainty about the  primary, I believe his prognosis is poor.  Per discussion by Dr. Delane Ginger with oncology, they did not feel that oncology consultation is appropriate unless we have a tissue diagnosis.  Dr. Ladona Ridgel is planning to meet with patient and family tomorrow to discuss goals of care, and I have discussed patient with Dr. Ladona Ridgel today.  If further diagnostic workup is desired by patient, then we can discuss with GI whether an endoscopic ultrasound might afford a safe biopsy if patient remains stable.

## 2013-06-06 NOTE — Progress Notes (Addendum)
Subjective:   Pt reports feeling good this AM--but has some occasional SOB.  He denies any CP, palpitations, N/V/D/C, or abdominal pain.  He had eggs and grits for breakfast.    He is aware of the family meeting tomorrow at 2pm to discuss goals of care.  However, he seems unaware of the gravity of his situation.  He seems to believe that they did an "operation" and "got most of it."     Brief HPI:  Pt is a 57 yo male with PMH significant for cholelithiasis who presented to [facility] ED on 05/30/13 with complaints of "sore", intermittent, worsening, non-radiating RUQ abdominal pain x 24h. Pain was 10/10 at worse.  Symptoms began on 12/17 in the morning while cleaning windows, worsened by palpation and alleviated by 4mg  of morphine.  He had a couple of episodes of NB emesis.  He tried naproxen x2 with minimal relief.  He had similar symptoms earlier this year and was found to have cholelithiasis, but was unable to follow up due to finances. He denied CP, fever/chills, hemetemesis, BRBPR, and melena. He admits to frequent EtOH use. He reports soaking night sweats but denies any recent weight loss.    Upon admission, CT abd/pelvis revealed innumerable hepatic metastases, as well as mucosal irregularity/eccentric wall thickening involving the distal esophagus just above the GE junction, worrisome for primary esophageal neoplasm.  Innumerable hepatic metastases.  Suspected lower paraesophageal and upper abdominal nodal metastases. Moderate abdominopelvic ascites with associated omental caking/peritoneal disease overlying the inferior liver.  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  Recommended an EUS with FNA.  However, CT scan was subsequently read as a suspected active perihepatic hemorrhage of a subcapsular lesion in the anterior segment right hepatic lobe with associated moderate  perihepatic hemorrhage along the  right hepatic lobe.  IR was consulted for possible embolization.  On 12/20 hgb dropped from 5.8 to 3.8. Pt became unresponsive and was in VT arrest thought to be due to hemorrhagic shock--pt was intubated and transferred to ICU.  IR performed embolization.  Pt hgb has remained stable and he was extubated on 12/23.  Palliative care consulted by ICU and goals of care meeting scheduled for Friday 12/26 at [facility].   He was transferred back to [facility] on 06/06/13.   Objective:    Vital signs in last 24 hours: Filed Vitals:   06/05/13 1300 06/05/13 1436 06/05/13 2100 06/06/13 0500  BP:  145/87 162/78 147/81  Pulse: 95 96 73 71  Temp:  97.5 F (36.4 C) 98.2 F (36.8 C) 98.1 F (36.7 C)  TempSrc:  Oral Oral Oral  Resp: 18 18 18 18   Height:      Weight:      SpO2: 95% 92% 94% 92%   Weight change:   Intake/Output Summary (Last 24 hours) at 06/06/13 1140 Last data filed at 06/05/13 2030  Gross per 24 hour  Intake    290 ml  Output    730 ml  Net   -440 ml    Physical Exam: Constitutional: Vital signs reviewed.  Patient is in no acute distress and cooperative with exam.   Head: Normocephalic and atraumatic Eyes: PERRL, EOMI, conjunctivae normal, +scleral icterus  Neck: Supple, Trachea midline; right area bandaged Cardiovascular: RRR, S1, S2 present, no MRG, DP 2+ b/l; 2+ pitting edema b/l Pulmonary/Chest: normal respiratory effort, CTAB, no wheezes, rales, or rhonchi Abdominal: Soft. +BS,  Non-tender, mildly distended Musculoskeletal: No joint deformities, erythema, or stiffness Genitourinary: significant scrotal edema Neurological: A&O x3, cranial nerve II-XII are grossly intact, moving all extremities  Skin: Warm, dry and intact. No rash Psychiatric: Normal mood and affect.   Lab Results:  BMP:  Recent Labs Lab 05/31/13 0455  06/05/13 0500 06/06/13 0645  NA 130*  < > 148* 143  K 5.1  < > 3.2* 3.8  CL 101  < > 112 106  CO2 18*  < > 25 24  GLUCOSE 83  < > 89 98  BUN 30*  < >  26* 28*  CREATININE 1.43*  < > 0.87 0.85  CALCIUM 8.1*  < > 7.6* 8.2*  PHOS 5.1*  --   --   --   < > = values in this interval not displayed.  CBC:  Recent Labs Lab 06/01/13 1030  06/03/13 0312 06/04/13 0445 06/06/13 1050  WBC 8.8  < > 12.2* 12.5* 19.3*  NEUTROABS 6.0  --  10.1*  --   --   HGB 3.8*  < > 11.4* 11.6* 11.9*  HCT 11.9*  < > 30.8* 32.9* 34.8*  MCV 95.2  < > 81.9 84.8 88.3  PLT 192  < > 110* 115* 123*  < > = values in this interval not displayed.  Coagulation:  Recent Labs Lab 05/30/13 1633 06/01/13 1026 06/02/13 0659 06/03/13 0312  LABPROT 14.7 24.3* 17.3* 17.6*  INR 1.17 2.27* 1.45 1.49    CBG:            Recent Labs Lab 06/05/13 1145 06/05/13 1723 06/05/13 2017 06/05/13 2353 06/06/13 0343 06/06/13 0749  GLUCAP 98 146* 147* 113* 99 107*   LFTs:  Recent Labs Lab 06/02/13 0135 06/03/13 0312  AST 1011* 594*  ALT 167* 116*  ALKPHOS 366* 370*  BILITOT 3.7* 4.7*  PROT 6.2 6.2  ALBUMIN 2.1* 1.7*   Cardiac Enzymes:  Recent Labs Lab 05/31/13 0455 06/01/13 1745  CKTOTAL 232  --   CKMB 3.9  --   TROPONINI  --  <0.30   Lab Results  Component Value Date   CKTOTAL 232 05/31/2013   CKMB 3.9 05/31/2013   TROPONINI <0.30 06/01/2013    EKG:  Date/Time:  Thursday May 30 2013 09:46:47 EST Ventricular Rate:  82 PR Interval:  138 QRS Duration: 72 QT Interval:  388 QTC Calculation: 453 R Axis:   68 Text Interpretation:  Sinus rhythm Normal ECG No significant change since last tracing Confirmed by Bebe Shaggy  MD, DONALD [phone]) on 05/30/2013 10:02:53 AM   Micro Results: Recent Results (from the past 240 hour(s))  MRSA PCR SCREENING     Status: None   Collection Time    05/30/13  5:34 PM      Result Value Range Status   MRSA by PCR NEGATIVE  NEGATIVE Final   Comment:            The GeneXpert MRSA Assay (FDA     approved for NASAL specimens     only), is one component of a     comprehensive MRSA colonization     surveillance  program. It is not     intended to diagnose MRSA     infection nor to guide or     monitor treatment for     MRSA infections.    Medications:  Scheduled Meds: . [START ON 06/07/2013] hydrocortisone  40 mg Oral Q12H  . hydrocortisone  50 mg Oral Daily  . insulin  aspart  0-9 Units Subcutaneous TID WC  . multivitamin with minerals  1 tablet Oral Daily  . sodium chloride  3 mL Intravenous Q12H  . thiamine  100 mg Oral Daily   Continuous Infusions:  PRN Meds:.sodium chloride, diphenhydrAMINE, sodium chloride  Antibiotics: Anti-infectives   None     Antibiotics Given (last 72 hours)   None      Day of Hospitalization:  7 Consults:    Assessment/Plan:   Principal Problem:   RUQ abdominal pain Active Problems:   Anemia   Mass of multiple sites of liver   Transaminitis   Protein calorie malnutrition   Elevated INR   Hyponatremia   Protein-calorie malnutrition, severe   Acute respiratory failure   Encephalopathy acute   Cardiac arrest   #Shock due to perihepatic hemorrhage- VS stable--s/p hepatic arterial embolization by IR.  Hgb stable on 12/23: 11.6.  -CBC in AM  #Multiple hepatic masses- Etiology unknown.  Pt with elevated liver enzymes which have trended down.  Pt with a h/o hepatitis C-possible HCC?  AFP 12/22: 670.9.  According to the literature, it is generally accepted that serum levels greater than 500 mcg/L in a high-risk patient is diagnostic of HCC.  GI has signed off-plan is to continue support care.  Palliative care consulted (Dr.Taylor) by ICU--family meeting tomorrow at 2pm with two sisters to discuss goals of care.  Discussed case with oncology (Dr. Arbutus Ped) who advised getting a biopsy would need to be the next step before having oncology involved.  Would consult GI for EUS after discussion with family.  -CMP in AM -await goals of care decision   #Adrenal insufficiency?- Pt currently on hydrocortisone 50mg  IV for possible adrenal insufficiency?   06/01/13 cortisol: 16.1 which is wnl.  -switch to oral hydrocortisone today and taper dose  #Hyperglycemia- Stable.  SSI-moderate started in the ICU for high bs.    CBG (last 3)   Recent Labs  06/05/13 2353 06/06/13 0343 06/06/13 0749  GLUCAP 113* 99 107*   -SSI-sensitive with CBG's -consider d/c SSI tomorrow; pt CBG's are only slightly elevated  #Scrotal edema-  -scrotal support  #Alcohol abuse-  -thiamine 100mg  daily -MVI  #Dispo- Disposition is deferred at this time, awaiting goals of care decision.  Anticipated discharge in approximately 1-2 day(s).    LOS: 7 days   Boykin Peek, MD 06/06/2013, 11:40 AM

## 2013-06-06 NOTE — Progress Notes (Signed)
   ICU transfer Note.  [name] is a 57 yo man with h/o cholelithiasis who presented to [facility] ED with complaints of "sore", intermittent, worsening, non-radiating RUQ abdominal pain x 24h, night sweats and 2 episodes of Vomiting. He had similar symptoms earlier this year and was found to have gallstones, but was unable to follow up due to financial constraints. Hx of freq EtOH use. No weight change, reports weight to be stable ~120 lbs.  Chest CT with contrast- 05/30/2013 - Mucosal irregularity/eccentric wall thickening involving the distal esophagus just above the GE junction, worrisome for primary esophageal neoplasm. Endoscopic correlation is suggested. Innumerable hepatic metastases, as described above. Suspected lower paraesophageal and upper abdominal nodal metastases. Moderate abdominopelvic ascites with associated omental caking/peritoneal disease overlying the inferior liver.   Abdominal Uss- 05/30/2013- Innumerable hepatic masses consistent with metastatic disease. Associated ascites present. CT of the abdomen pelvis suggested for further evaluation.   On disscussion with IR- Review of scans- On additional review, one of the subcapsular metastases within the anterior segment right hepatic lobe may be hemorrhaging along the right hepatic lobe (series 7/image 30; series 11/image 27). As such, the high density material along the right liver is favored to  reflect perihepatic hemorrhage rather than peritoneal disease (series 7/image 46).  ADDENDED IMPRESSION:  Suspected active perihepatic hemorrhage of a subcapsular lesion in the anterior segment right hepatic lobe. Associated moderate perihepatic hemorrhage along the right hepatic lobe   GI was consulted- On the basis of possible oesophageal CA. EGD 05/31/2013- was revealed- IMPRESSION: Diid not show an esophageal mass.  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  2) 2-3 cm sliding hiatal hernia Recs by GI- 1) A EUS with FNA  can be pursued if Radiology does not want to  attempt any liver biopsy.  06/01/2013- Patient developed Cardiac arrest with respiratory failure, code blue was called, Pt was intubated and transferred to the unit. Pt Hgb- 5.8 down from 8.2 the previosu day, was therefore transfused. 12/20 CT abdomen >>> Suspected active perihepatic hemorrhage of a subcapsular lesion in the anterior segment right hepatic lobe. Associated moderate perihepatic hemorrhage along the right hepatic lobe. Mucosal irregularity/eccentric wall thickening involving the distal esophagus just above the GE junction, worrisome for primary esophageal neoplasm. Innumerable hepatic metastases, as described above. Suspected lower paraesophageal and upper abdominal nodal metastases. Moderate abdominopelvic ascites.  12/20 IR for emergent hepatic/messenteric embolization, Proc - Hepatic parenchymal hemorrhage and embolization.  Find - Active axtrav from R lobe.  Embo - gelfoam and 5x2 mm coil   12/22 Goals of care meeting >>> no consensus  12/23 Goals of care meeting >>> DNI / DNR  Labs- 06/04/2013- CBC- Hgb- 11.6, WBC- 12.5, Plt- 115.       BMP- Na- 143, K- 3.0, Bicarb- 23, Cr- 0.97, BUN- 25.   Plan- Palliative care Consult. - Supportive managment

## 2013-06-07 DIAGNOSIS — Z515 Encounter for palliative care: Secondary | ICD-10-CM

## 2013-06-07 LAB — COMPREHENSIVE METABOLIC PANEL
ALT: 82 U/L — ABNORMAL HIGH (ref 0–53)
AST: 185 U/L — ABNORMAL HIGH (ref 0–37)
Alkaline Phosphatase: 436 U/L — ABNORMAL HIGH (ref 39–117)
Calcium: 8.2 mg/dL — ABNORMAL LOW (ref 8.4–10.5)
Chloride: 112 mEq/L (ref 96–112)
Creatinine, Ser: 0.87 mg/dL (ref 0.50–1.35)
GFR calc non Af Amer: >90 mL/min (ref >90)
Sodium: 149 mEq/L — ABNORMAL HIGH (ref 135–145)
Total Protein: 6.6 g/dL (ref 6.0–8.3)

## 2013-06-07 LAB — GLUCOSE, CAPILLARY
Glucose-Capillary: 112 mg/dL — ABNORMAL HIGH (ref 70–99)
Glucose-Capillary: 133 mg/dL — ABNORMAL HIGH (ref 70–99)
Glucose-Capillary: 128 mg/dL — ABNORMAL HIGH (ref 70–99)
Glucose-Capillary: 148 mg/dL — ABNORMAL HIGH (ref 70–99)

## 2013-06-07 LAB — CBC
MCH: 29.7 pg (ref 26.0–34.0)
MCHC: 33 g/dL (ref 30.0–36.0)
MCV: 89.9 fL (ref 78.0–100.0)
Platelets: 137 10*3/uL — ABNORMAL LOW (ref 150–400)
RDW: 17.6 % — ABNORMAL HIGH (ref 11.5–15.5)

## 2013-06-07 LAB — MAGNESIUM: Magnesium: 2.4 mg/dL (ref 1.5–2.5)

## 2013-06-07 MED ORDER — POTASSIUM CHLORIDE CRYS ER 20 MEQ PO TBCR: 40.0000 meq | EXTENDED_RELEASE_TABLET | Freq: Once | ORAL | Status: DC

## 2013-06-07 MED ORDER — ENSURE COMPLETE PO LIQD
237.0000 mL | Freq: Two times a day (BID) | ORAL | Status: DC
  Administered 2013-06-08 – 2013-06-14 (×6): 237 mL via ORAL

## 2013-06-07 MED ORDER — FOLIC ACID 1 MG PO TABS
1.0000 mg | ORAL_TABLET | Freq: Every day | ORAL | Status: DC
  Administered 2013-06-07 – 2013-06-17 (×11): 1 mg via ORAL
  Filled 2013-06-07 (×11): qty 1

## 2013-06-07 MED ORDER — POTASSIUM CHLORIDE CRYS ER 20 MEQ PO TBCR
40.0000 meq | EXTENDED_RELEASE_TABLET | Freq: Three times a day (TID) | ORAL | Status: AC
  Administered 2013-06-07 (×3): 40 meq via ORAL
  Filled 2013-06-07 (×3): qty 2

## 2013-06-07 MED ORDER — POTASSIUM CHLORIDE CRYS ER 20 MEQ PO TBCR: 40.0000 meq | EXTENDED_RELEASE_TABLET | Freq: Two times a day (BID) | ORAL | Status: DC

## 2013-06-07 NOTE — Progress Notes (Addendum)
Subjective:   Pt reports feeling good this AM.  He is sitting in the recliner when I enter the room.  He reports having grits, eggs, and bacon for breakfast.  He denies any CP, palpitations, N/V/D/C, or abdominal pain.  He reports his stool is normal color and consistency.  He has occasional SOB.  Otherwise no complaints.    Objective:   Vital signs in last 24 hours: Filed Vitals:   06/06/13 0500 06/06/13 1400 06/06/13 2102 06/07/13 0505  BP: 147/81 153/70 134/84 156/75  Pulse: 71 67 66 63  Temp: 98.1 F (36.7 C) 97.1 F (36.2 C) 98.9 F (37.2 C) 98.3 F (36.8 C)  TempSrc: Oral Oral Oral Oral  Resp: 18 18 18 16   Height:      Weight:      SpO2: 92% 92% 91% 92%   Weight change:   Intake/Output Summary (Last 24 hours) at 06/07/13 1003 Last data filed at 06/07/13 0830  Gross per 24 hour  Intake    360 ml  Output      0 ml  Net    360 ml    Physical Exam: Constitutional: Vital signs reviewed.  Patient is sitting in a recliner in no acute distress and cooperative with exam.   Head: Normocephalic and atraumatic Eyes: PERRL, EOMI, +scleral icterus  Neck: Supple, Trachea midline Cardiovascular: irregular rhythm, regular rate, S1, S2 present, no MRG, DP 2+ b/l; 3+ pitting edema up to the thighs b/l Pulmonary/Chest: normal respiratory effort, CTAB, no wheezes, rales, or rhonchi; no gynecomastia Abdominal: Soft. +BS, Non-tender, mildly distended Musculoskeletal: No joint deformities, erythema, or stiffness Genitourinary: Scrotal edema. Neurological: A&O x3, cranial nerve II-XII are grossly intact, moving all extremities  Skin: Warm, dry and intact. No rash Psychiatric: Normal mood and affect.   Lab Results:  BMP:  Recent Labs Lab 06/06/13 0645 06/07/13 0545  NA 143 149*  K 3.8 2.6*  CL 106 112  CO2 24 24  GLUCOSE 98 99  BUN 28* 27*  CREATININE 0.85 0.87  CALCIUM 8.2* 8.2*  MG  --  2.4    CBC:  Recent Labs Lab 06/01/13 1030  06/03/13 0312   06/06/13 1050 06/07/13 0545  WBC 8.8  < > 12.2*  < > 19.3* 19.2*  NEUTROABS 6.0  --  10.1*  --   --   --   HGB 3.8*  < > 11.4*  < > 11.9* 11.5*  HCT 11.9*  < > 30.8*  < > 34.8* 34.8*  MCV 95.2  < > 81.9  < > 88.3 89.9  PLT 192  < > 110*  < > 123* 137*  < > = values in this interval not displayed.  Coagulation:  Recent Labs Lab 06/01/13 1026 06/02/13 0659 06/03/13 0312  LABPROT 24.3* 17.3* 17.6*  INR 2.27* 1.45 1.49    CBG:            Recent Labs Lab 06/06/13 0343 06/06/13 0749 06/06/13 1155 06/06/13 1617 06/06/13 2152 06/07/13 0826  GLUCAP 99 107* 105* 131* 111* 112*          LFTs:  Recent Labs Lab 06/03/13 0312 06/07/13 0545  AST 594* 185*  ALT 116* 82*  ALKPHOS 370* 436*  BILITOT 4.7* 2.8*  PROT 6.2 6.6  ALBUMIN 1.7* 1.7*    Cardiac Enzymes:  Recent Labs Lab 06/01/13 1745  TROPONINI <0.30   Lab Results  Component Value Date   CKTOTAL 232 05/31/2013   CKMB 3.9 05/31/2013  TROPONINI <0.30 06/01/2013    EKG:  Date/Time:  Thursday May 30 2013 09:46:47 EST Ventricular Rate:  82 PR Interval:  138 QRS Duration: 72 QT Interval:  388 QTC Calculation: 453 R Axis:   68 Text Interpretation:  Sinus rhythm Normal ECG No significant change since last tracing Confirmed by Bebe Shaggy  MD, DONALD [phone]) on 05/30/2013 10:02:53 AM   Micro Results: Recent Results (from the past 240 hour(s))  MRSA PCR SCREENING     Status: None   Collection Time    05/30/13  5:34 PM      Result Value Range Status   MRSA by PCR NEGATIVE  NEGATIVE Final   Comment:            The GeneXpert MRSA Assay (FDA     approved for NASAL specimens     only), is one component of a     comprehensive MRSA colonization     surveillance program. It is not     intended to diagnose MRSA     infection nor to guide or     monitor treatment for     MRSA infections.    Medications:  Scheduled Meds: . hydrocortisone  40 mg Oral Q12H  . multivitamin with minerals  1 tablet Oral  Daily  . potassium chloride SA  40 mEq Oral TID  . sodium chloride  3 mL Intravenous Q12H  . thiamine  100 mg Oral Daily   Continuous Infusions:  PRN Meds:.sodium chloride, diphenhydrAMINE, sodium chloride  Antibiotics: Anti-infectives   None     Antibiotics Given (last 72 hours)   None       Day of Hospitalization:  8 Consults:    Assessment/Plan:   Principal Problem:   RUQ abdominal pain Active Problems:   Anemia   Mass of multiple sites of liver   Transaminitis   Protein calorie malnutrition   Elevated INR   Hyponatremia   Protein-calorie malnutrition, severe   Acute respiratory failure   Encephalopathy acute   Cardiac arrest  #Shock due to perihepatic hemorrhage- VS stable--s/p hepatic arterial embolization by IR. Hgb stable on 12/26: 11.5.  -CBC in AM   #Multiple hepatic masses- Etiology unknown. Pt with elevated liver enzymes which have trended down.  MELD score: 15.  Also with sequela of chronic liver disease including hypoalbuminemia, thrombocytopenia, hyperbilirubinemia, and prolonged PT.  Pt with a h/o hepatitis C-possible HCC? AFP 12/22: 670.9. According to the literature, it is generally accepted that serum levels greater than 500 mcg/L in a high-risk patient is diagnostic of HCC. GI has signed off-plan is to continue support care. Palliative care consulted (Dr.Taylor) by ICU--family meeting today at 2pm with two sisters to discuss goals of care. Discussed case with oncology (Dr. Arbutus Ped) who advised getting a biopsy would need to be the next step before having oncology involved. Would consult GI for EUS after discussion with family.      Component Value Date/Time   PROT 6.6 06/07/2013 0545   ALBUMIN 1.7* 06/07/2013 0545   AST 185* 06/07/2013 0545   ALT 82* 06/07/2013 0545   ALKPHOS 436* 06/07/2013 0545   BILITOT 2.8* 06/07/2013 0545   -await goals of care decision   #Hypokalemia- 12/26 potassium: 2.6  Lab Results  Component Value Date   K 2.6*  06/07/2013   -replete with tid  #Adrenal insufficiency?- 06/01/13 cortisol: 16.1 which is wnl.  -taper dose   #Hyperglycemia- Resolved.   -CBG's  -d/c SSI-pt CBG's are only slightly elevated   #  Scrotal edema-  -continue scrotal support  #Severe malnutrition in the context of chronic illness  -consult nutrition   #Dispo- Disposition is deferred at this time, awaiting improvement of current medical problems.  Anticipated discharge in approximately 1-2 day(s).    LOS: 8 days   Boykin Peek, MD PGY-1, Internal Medicine  [phone] 06/07/2013, 10:03 AM

## 2013-06-07 NOTE — Consult Note (Signed)
Patient [id number] Comp      DOB: April 02, 1956      FAO:[id number]  Met with Patient , two sisters and nephew Esperanza Sheets [phone], Milana Obey [phone], Dois Davenport [phone])  Note sisters are not in agreement with each other on code status change, but they feel the patient is in his right mind and has the right to make a choice to undergo resuscitation.  We talked about how that might change over time as we see how his cancer is going knowing that it may cause his death in the near future.  He is agreeable to to reconsider this as more information becomes available.  Patient able to tell me that he has hepatitis and cancer in his liver.  He is oriented to time, place and person.  He relates that even with a lot going against him he would like to try to see if he can have treatment and prolong his life in a meaningful way.    His family is in support of finding out if biopsy is possible and then talk further with oncology.   Recommend:  1.  Change code status to Full code  2.  Obtain consult for biopsy.  3.  Arrange followup with oncology once tisse obtained.     4. PT eval for safety.  Lives with a cousin at home and hopes to return their.    5.  Once diagnosis and prognosis obtained, glad to offer hospice resources when the time is right.   Total time:  200 pm - 240 pm  Yandriel Boening L. Ladona Ridgel, MD MBA The Palliative Medicine Team at [facility] Phone: [phone] Pager: [phone]

## 2013-06-07 NOTE — Progress Notes (Signed)
NUTRITION FOLLOW UP  Intervention:   1.  Supplements; Ensure Complete po BID, each supplement provides 350 kcal and 13 grams of protein  Nutrition Dx:   Inadequate oral intake, ongoing.   Monitor:   1.  Food/Beverage; pt meeting >/=90% estimated needs with tolerance. 2.  Wt/wt change; monitor trends  Assessment:   Patient with PMH of cholelithiasis & ETOH abuse (3-4 40 oz daily) who presented to [facility] ED with complaints of "sore", intermittent, worsening, non-radiating RUQ abdominal pain. 12/18 [place] of abdomen showed liver masses with metastases.   EGD did not show esophageal lesions.  Pt s/p acute hemorrhage (12/20) which has been resolved.  Pt with ongoing discussions r/t to prognosis and plan of care.  Next step is biopsy, if able. PMT involved.   RD met with pt who states "my only question is can I have oranges and pinto beans."  RD answered questions for patient and encouraged intake.  Discussed getting enough nutrition to meet estimated needs and suggested ways to achieve.   Height: Ht Readings from Last 1 Encounters:  05/30/13 5\' 6"  (1.676 m)    Weight Status:   Wt Readings from Last 1 Encounters:  06/03/13 151 lb 14.4 oz (68.9 kg)    Re-estimated needs:  Kcal: 1930-2070 Protein: 82-96g Fluid: ~2.0 L/day  Skin: Generalized edema  Diet Order: General   Intake/Output Summary (Last 24 hours) at 06/07/13 1425 Last data filed at 06/07/13 0830  Gross per 24 hour  Intake    120 ml  Output      0 ml  Net    120 ml    Last BM: 12/24   Labs:   Recent Labs Lab 06/05/13 0500 06/06/13 0645 06/07/13 0545  NA 148* 143 149*  K 3.2* 3.8 2.6*  CL 112 106 112  CO2 25 24 24   BUN 26* 28* 27*  CREATININE 0.87 0.85 0.87  CALCIUM 7.6* 8.2* 8.2*  MG  --   --  2.4  GLUCOSE 89 98 99    CBG (last 3)   Recent Labs  06/06/13 2152 06/07/13 0826 06/07/13 1140  GLUCAP 111* 112* 133*    Scheduled Meds: . folic acid  1 mg Oral Daily  . hydrocortisone  40 mg Oral Q12H   . multivitamin with minerals  1 tablet Oral Daily  . potassium chloride SA  40 mEq Oral TID  . sodium chloride  3 mL Intravenous Q12H  . thiamine  100 mg Oral Daily    Continuous Infusions:   Loyce Dys, MS RD LDN Clinical Inpatient Dietitian Pager: [phone] Weekend/After hours pager: [phone]
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
## 2013-06-08 DIAGNOSIS — E46 Unspecified protein-calorie malnutrition: Secondary | ICD-10-CM

## 2013-06-08 LAB — COMPREHENSIVE METABOLIC PANEL
ALT: 87 U/L — ABNORMAL HIGH (ref 0–53)
AST: 187 U/L — ABNORMAL HIGH (ref 0–37)
Calcium: 8.4 mg/dL (ref 8.4–10.5)
Creatinine, Ser: 0.84 mg/dL (ref 0.50–1.35)
GFR calc Af Amer: >90 mL/min (ref >90)
GFR calc non Af Amer: >90 mL/min (ref >90)
Glucose, Bld: 115 mg/dL — ABNORMAL HIGH (ref 70–99)
Sodium: 149 mEq/L — ABNORMAL HIGH (ref 135–145)
Total Protein: 6.9 g/dL (ref 6.0–8.3)

## 2013-06-08 LAB — GLUCOSE, CAPILLARY
Glucose-Capillary: 108 mg/dL — ABNORMAL HIGH (ref 70–99)
Glucose-Capillary: 138 mg/dL — ABNORMAL HIGH (ref 70–99)
Glucose-Capillary: 119 mg/dL — ABNORMAL HIGH (ref 70–99)
Glucose-Capillary: 125 mg/dL — ABNORMAL HIGH (ref 70–99)

## 2013-06-08 LAB — CBC
HCT: 36.6 % — ABNORMAL LOW (ref 39.0–52.0)
Hemoglobin: 12.2 g/dL — ABNORMAL LOW (ref 13.0–17.0)
MCH: 30.2 pg (ref 26.0–34.0)
MCHC: 33.3 g/dL (ref 30.0–36.0)
Platelets: 163 10*3/uL (ref 150–400)
RBC: 4.04 MIL/uL — ABNORMAL LOW (ref 4.22–5.81)

## 2013-06-08 MED ORDER — POTASSIUM CHLORIDE CRYS ER 20 MEQ PO TBCR
40.0000 meq | EXTENDED_RELEASE_TABLET | Freq: Two times a day (BID) | ORAL | Status: DC
  Administered 2013-06-08 – 2013-06-14 (×12): 40 meq via ORAL
  Filled 2013-06-08 (×5): qty 2
  Filled 2013-06-08: qty 4
  Filled 2013-06-08 (×10): qty 2

## 2013-06-08 MED ORDER — HYDROCORTISONE 20 MG PO TABS
20.0000 mg | ORAL_TABLET | Freq: Two times a day (BID) | ORAL | Status: DC
  Administered 2013-06-08 – 2013-06-09 (×4): 20 mg via ORAL
  Filled 2013-06-08 (×6): qty 1

## 2013-06-08 MED ORDER — OXYCODONE HCL 5 MG PO TABS
10.0000 mg | ORAL_TABLET | Freq: Once | ORAL | Status: AC
  Administered 2013-06-08: 10 mg via ORAL
  Filled 2013-06-08: qty 2

## 2013-06-08 MED ORDER — POTASSIUM CHLORIDE CRYS ER 20 MEQ PO TBCR
40.0000 meq | EXTENDED_RELEASE_TABLET | Freq: Two times a day (BID) | ORAL | Status: DC
  Administered 2013-06-08: 40 meq via ORAL
  Filled 2013-06-08: qty 2

## 2013-06-08 NOTE — Progress Notes (Signed)
Asked to reevaluate pt after successful resuscitation following subcapsular bleed into hepatic metastasis.  Primary still not known.  EGD was negative for GI primary.  Alpha feto protein is significantly elevated.   Would consult IR again regarding CT or ultrasound guided liver biopsy.  Hepatomas have a tendency to bleed (as evidenced by prior spontaneous bleed).  Would also check Ca 19-9, CEA levels.

## 2013-06-08 NOTE — Progress Notes (Signed)
Internal Medicine Attending  Date: 06/08/2013  Patient name: Micheal Daniel Medical record number: [id number] Date of birth: Aug 02, 1955 Age: 57 y.o. Gender: male  I saw and evaluated the patient and discussed his care with house staff. I reviewed the resident's note by Dr. Delane Ginger and Dr. Zada Girt and I agree with the residents' findings and plans as documented in their note, with the following additional comments.  Patient is pain free and doing well; hemoglobin is stable   Since patient desires a definitive diagnosis if possible, we consulted interventional radiology to see if a liver biopsy can be safely accomplished given his recent subcapsular bleed.    Dr. Rogelia Boga will take over as attending physician tomorrow 06/09/2013.

## 2013-06-08 NOTE — Progress Notes (Addendum)
Subjective:   Pt reports feeling good this AM.  He is sitting in the recliner when I enter the room.  He is eating well and denies any CP, palpitations, N/V/D/C, or abdominal pain.  He still has occasional SOB.  Otherwise no complaints.    Objective:   Vital signs in last 24 hours: Filed Vitals:   06/07/13 0505 06/07/13 1441 06/07/13 2116 06/08/13 0527  BP: 156/75 155/76 163/76 150/72  Pulse: 63 72 75 76  Temp: 98.3 F (36.8 C) 98 F (36.7 C) 98.4 F (36.9 C) 98.4 F (36.9 C)  TempSrc: Oral Oral Oral Oral  Resp: 16 18 18 18   Height:      Weight:      SpO2: 92% 96% 96% 99%   Weight change:   Intake/Output Summary (Last 24 hours) at 06/08/13 1033 Last data filed at 06/08/13 0930  Gross per 24 hour  Intake    840 ml  Output      0 ml  Net    840 ml    Physical Exam: Constitutional: Vital signs reviewed.  Patient is sitting in a recliner in no acute distress and cooperative with exam.   Head: Normocephalic and atraumatic Eyes: PERRL, EOMI, +scleral icterus  Neck: Supple, Trachea midline Cardiovascular: irregular rhythm, regular rate, S1, S2 present, no MRG, DP 2+ b/l; 3+ pitting edema up to the thighs b/l Pulmonary/Chest: normal respiratory effort, CTAB, no wheezes, rales, or rhonchi; no gynecomastia Abdominal: Soft. +BS, diffuse tenderness to palpation, mildly distended Musculoskeletal: No joint deformities, erythema, or stiffness Genitourinary: Scrotal edema. Neurological: A&O x3, cranial nerve II-XII are grossly intact, moving all extremities  Skin: Warm, dry and intact. No rash Psychiatric: Normal mood and affect.   Lab Results:  BMP:  Recent Labs Lab 06/07/13 0545 06/08/13 0450  NA 149* 149*  K 2.6* 3.2*  CL 112 114*  CO2 24 22  GLUCOSE 99 115*  BUN 27* 26*  CREATININE 0.87 0.84  CALCIUM 8.2* 8.4  MG 2.4  --     CBC:  Recent Labs Lab 06/03/13 0312  06/07/13 0545 06/08/13 0450  WBC 12.2*  < > 19.2* 20.8*  NEUTROABS 10.1*  --   --   --    HGB 11.4*  < > 11.5* 12.2*  HCT 30.8*  < > 34.8* 36.6*  MCV 81.9  < > 89.9 90.6  PLT 110*  < > 137* 163  < > = values in this interval not displayed.  Coagulation:  Recent Labs Lab 06/02/13 0659 06/03/13 0312  LABPROT 17.3* 17.6*  INR 1.45 1.49    CBG:            Recent Labs Lab 06/06/13 2152 06/07/13 0826 06/07/13 1140 06/07/13 1649 06/07/13 2115 06/08/13 0740  GLUCAP 111* 112* 133* 128* 148* 108*          LFTs:  Recent Labs Lab 06/07/13 0545 06/08/13 0450  AST 185* 187*  ALT 82* 87*  ALKPHOS 436* 458*  BILITOT 2.8* 3.1*  PROT 6.6 6.9  ALBUMIN 1.7* 1.8*    Cardiac Enzymes:  Recent Labs Lab 06/01/13 1745  TROPONINI <0.30   Lab Results  Component Value Date   CKTOTAL 232 05/31/2013   CKMB 3.9 05/31/2013   TROPONINI <0.30 06/01/2013    EKG:  Date/Time:  Thursday May 30 2013 09:46:47 EST Ventricular Rate:  82 PR Interval:  138 QRS Duration: 72 QT Interval:  388 QTC Calculation: 453 R Axis:   68 Text Interpretation:  Sinus rhythm Normal ECG No significant change since last tracing Confirmed by Bebe Shaggy  MD, DONALD [phone]) on 05/30/2013 10:02:53 AM   Micro Results: Recent Results (from the past 240 hour(s))  MRSA PCR SCREENING     Status: None   Collection Time    05/30/13  5:34 PM      Result Value Range Status   MRSA by PCR NEGATIVE  NEGATIVE Final   Comment:            The GeneXpert MRSA Assay (FDA     approved for NASAL specimens     only), is one component of a     comprehensive MRSA colonization     surveillance program. It is not     intended to diagnose MRSA     infection nor to guide or     monitor treatment for     MRSA infections.    Medications:  Scheduled Meds: . feeding supplement (ENSURE COMPLETE)  237 mL Oral BID BM  . folic acid  1 mg Oral Daily  . multivitamin with minerals  1 tablet Oral Daily  . potassium chloride SA  40 mEq Oral BID  . sodium chloride  3 mL Intravenous Q12H  . thiamine  100 mg Oral  Daily   Continuous Infusions:  PRN Meds:.sodium chloride, diphenhydrAMINE, sodium chloride  Antibiotics: Anti-infectives   None     Antibiotics Given (last 72 hours)   None       Day of Hospitalization:  9 Consults:    Assessment/Plan:   Principal Problem:   RUQ abdominal pain Active Problems:   Anemia   Mass of multiple sites of liver   Transaminitis   Protein calorie malnutrition   Elevated INR   Hyponatremia   Protein-calorie malnutrition, severe   Acute respiratory failure   Encephalopathy acute   Cardiac arrest  #Shock due to perihepatic hemorrhage- VS stable--s/p hepatic arterial embolization by IR. Hgb stable on 12/27: 12.2. -CBC in AM   #Multiple hepatic masses- Etiology unknown. Pt with elevated liver enzymes which have trended down.  MELD score: 15.  Also with sequela of chronic liver disease including hypoalbuminemia, thrombocytopenia, hyperbilirubinemia, and prolonged PT. Pt with a h/o hepatitis C-possible HCC? AFP 12/22: 670.9. According to the literature, it is generally accepted that serum levels greater than 500 mcg/L in a high-risk patient is diagnostic of HCC. Palliative care consulted (Dr.Taylor) by ICU--family meeting yesterday to discuss goals of care. Pt is full code.  Pt and family would like to pursue a diagnosis.  Discussed case with oncology (Dr. Arbutus Ped) who advised getting a biopsy would need to be the next step before having oncology involved.  GI consulted today and does not want to pursue the EUS for biopsy-recommended CEA and CA19-9.   Plan   - We discussed with Dr. Fredia Sorrow of IR regarding the best approach for obtaining a tissue biopsy, given that GI did not feel endoscopy is needed. He reviewed the images and he felt that percutaneous liver biopsy is a possibility. However, this is not done over the weekend.  It is planned for Monday. IR team will reevaluate the patient tomorrow.  - Order INR and PTT with am labs.  -CEA, 19-9 pending per  GI recommendations -CBC in AM -PT/INR tomorrow morning per IR     Component Value Date/Time   PROT 6.9 06/08/2013 0450   ALBUMIN 1.8* 06/08/2013 0450   AST 187* 06/08/2013 0450   ALT 87* 06/08/2013 0450   ALKPHOS  458* 06/08/2013 0450   BILITOT 3.1* 06/08/2013 0450   -IR to see pt tomorrow; possible IR biopsy on Monday   #Hypokalemia-  Improved.  12/27 potassium: 3.2  Lab Results  Component Value Date   K 3.2* 06/08/2013   -CMP in AM -replete  #Adrenal insufficiency?- 06/01/13 cortisol: 16.1 which is wnl.  -taper dose   #Hyperglycemia- Resolved.   -CBG's  -d/c SSI-pt CBG's are only slightly elevated   #Scrotal edema-  -continue scrotal support  #Severe malnutrition in the context of chronic illness- nutrition consulted and recommended ensure complete po bid which was ordered -appreciate RD recommendations   #Dispo- Disposition is deferred at this time, awaiting GI input.  Anticipated discharge in approximately 1-2 days.    LOS: 9 days   Boykin Peek, MD PGY-1, Internal Medicine  [phone] 06/08/2013, 10:33 AM

## 2013-06-09 DIAGNOSIS — E871 Hypo-osmolality and hyponatremia: Secondary | ICD-10-CM

## 2013-06-09 LAB — CBC
HCT: 37 % — ABNORMAL LOW (ref 39.0–52.0)
Hemoglobin: 12.2 g/dL — ABNORMAL LOW (ref 13.0–17.0)
MCV: 91.4 fL (ref 78.0–100.0)
RDW: 18 % — ABNORMAL HIGH (ref 11.5–15.5)
WBC: 21.6 10*3/uL — ABNORMAL HIGH (ref 4.0–10.5)

## 2013-06-09 LAB — BASIC METABOLIC PANEL
BUN: 27 mg/dL — ABNORMAL HIGH (ref 6–23)
CO2: 24 mEq/L (ref 19–32)
Calcium: 8.5 mg/dL (ref 8.4–10.5)
Chloride: 115 mEq/L — ABNORMAL HIGH (ref 96–112)
Creatinine, Ser: 0.89 mg/dL (ref 0.50–1.35)
GFR calc Af Amer: >90 mL/min (ref >90)
GFR calc non Af Amer: >90 mL/min (ref >90)
Glucose, Bld: 103 mg/dL — ABNORMAL HIGH (ref 70–99)
Potassium: 3.3 mEq/L — ABNORMAL LOW (ref 3.5–5.1)
Sodium: 149 mEq/L — ABNORMAL HIGH (ref 135–145)

## 2013-06-09 LAB — URINE MICROSCOPIC-ADD ON

## 2013-06-09 LAB — GLUCOSE, CAPILLARY
Glucose-Capillary: 98 mg/dL (ref 70–99)
Glucose-Capillary: 95 mg/dL (ref 70–99)
Glucose-Capillary: 129 mg/dL — ABNORMAL HIGH (ref 70–99)
Glucose-Capillary: 112 mg/dL — ABNORMAL HIGH (ref 70–99)

## 2013-06-09 LAB — URINALYSIS, ROUTINE W REFLEX MICROSCOPIC
Ketones, ur: NEGATIVE mg/dL
Nitrite: POSITIVE — AB
Protein, ur: 30 mg/dL — AB
Urobilinogen, UA: 1 mg/dL (ref 0.0–1.0)

## 2013-06-09 MED ORDER — DEXTROSE 5 % IV SOLN
2.0000 g | INTRAVENOUS | Status: DC
  Administered 2013-06-09 – 2013-06-14 (×6): 2 g via INTRAVENOUS
  Filled 2013-06-09 (×9): qty 2

## 2013-06-09 NOTE — Progress Notes (Signed)
Subjective:   Patient seen and examined at the bedside this morning. He is sitting in the recliner when I enter the room. He says he feels well. He is eating fine and denies any chest pain, palpitations, N/V/D/C, or abdominal pain. He still has occasional SOB and cough. Otherwise no complaints.    Objective:   Vital signs in last 24 hours: Filed Vitals:   06/08/13 1357 06/08/13 2116 06/09/13 0300 06/09/13 0500  BP: 154/77 155/77  174/82  Pulse: 69 76  70  Temp: 98.4 F (36.9 C) 98 F (36.7 C)  98.2 F (36.8 C)  TempSrc: Oral Oral  Oral  Resp: 20 20 18 18   Height:      Weight:      SpO2: 98% 90%  93%   Weight change:   Intake/Output Summary (Last 24 hours) at 06/09/13 1009 Last data filed at 06/08/13 1300  Gross per 24 hour  Intake    240 ml  Output      0 ml  Net    240 ml    Physical Exam: Constitutional: Vital signs reviewed.  Patient is sitting in a recliner in no acute distress and cooperative with exam.   Head: Normocephalic and atraumatic Eyes: PERRL, EOMI, +scleral icterus  Neck: Supple, Trachea midline Cardiovascular: irregular rhythm, regular rate, S1, S2 present, no MRG, DP 2+ b/l; 3+ pitting edema up to the thighs b/l Pulmonary/Chest: normal respiratory effort, soft crackles at right lung base; no gynecomastia Abdominal: Soft. +BS, diffuse tenderness to palpation, mildly distended Musculoskeletal: No joint deformities, erythema, or stiffness Genitourinary: Scrotal edema. Neurological: A&O x3, cranial nerve II-XII are grossly intact, moving all extremities  Skin: Warm, dry and intact. No rash Psychiatric: Normal mood and affect.   Lab Results:  BMP:  Recent Labs Lab 06/07/13 0545 06/08/13 0450 06/09/13 0605  NA 149* 149* 149*  K 2.6* 3.2* 3.3*  CL 112 114* 115*  CO2 24 22 24   GLUCOSE 99 115* 103*  BUN 27* 26* 27*  CREATININE 0.87 0.84 0.89  CALCIUM 8.2* 8.4 8.5  MG 2.4  --   --     CBC:  Recent Labs Lab 06/03/13 0312   06/08/13 0450 06/09/13 0605  WBC 12.2*  < > 20.8* 21.6*  NEUTROABS 10.1*  --   --   --   HGB 11.4*  < > 12.2* 12.2*  HCT 30.8*  < > 36.6* 37.0*  MCV 81.9  < > 90.6 91.4  PLT 110*  < > 163 211  < > = values in this interval not displayed.  Coagulation:  Recent Labs Lab 06/03/13 0312 06/09/13 0605  LABPROT 17.6* 14.0  INR 1.49 1.10    CBG:            Recent Labs Lab 06/07/13 2115 06/08/13 0740 06/08/13 1116 06/08/13 1557 06/08/13 2112 06/09/13 0819  GLUCAP 148* 108* 138* 119* 125* 98          LFTs:  Recent Labs Lab 06/07/13 0545 06/08/13 0450  AST 185* 187*  ALT 82* 87*  ALKPHOS 436* 458*  BILITOT 2.8* 3.1*  PROT 6.6 6.9  ALBUMIN 1.7* 1.8*    Cardiac Enzymes: No results found for this basename: CKTOTAL, CKMB, CKMBINDEX, TROPONINI,  in the last 168 hours Lab Results  Component Value Date   CKTOTAL 232 05/31/2013   CKMB 3.9 05/31/2013   TROPONINI <0.30 06/01/2013    EKG:  Date/Time:  Thursday May 30 2013 09:46:47 EST Ventricular Rate:  82 PR Interval:  138 QRS Duration: 72 QT Interval:  388 QTC Calculation: 453 R Axis:   68 Text Interpretation:  Sinus rhythm Normal ECG No significant change since last tracing Confirmed by Bebe Shaggy  MD, DONALD [phone]) on 05/30/2013 10:02:53 AM   Micro Results: Recent Results (from the past 240 hour(s))  MRSA PCR SCREENING     Status: None   Collection Time    05/30/13  5:34 PM      Result Value Range Status   MRSA by PCR NEGATIVE  NEGATIVE Final   Comment:            The GeneXpert MRSA Assay (FDA     approved for NASAL specimens     only), is one component of a     comprehensive MRSA colonization     surveillance program. It is not     intended to diagnose MRSA     infection nor to guide or     monitor treatment for     MRSA infections.    Medications:  Scheduled Meds: . feeding supplement (ENSURE COMPLETE)  237 mL Oral BID BM  . folic acid  1 mg Oral Daily  . hydrocortisone  20 mg Oral BID  .  multivitamin with minerals  1 tablet Oral Daily  . potassium chloride SA  40 mEq Oral BID  . sodium chloride  3 mL Intravenous Q12H  . thiamine  100 mg Oral Daily   Continuous Infusions:  PRN Meds:.sodium chloride, diphenhydrAMINE, sodium chloride  Antibiotics: Anti-infectives   None     Antibiotics Given (last 72 hours)   None       Day of Hospitalization:  10 Consults:    Assessment/Plan:   Principal Problem:   RUQ abdominal pain Active Problems:   Anemia   Mass of multiple sites of liver   Transaminitis   Protein calorie malnutrition   Elevated INR   Hyponatremia   Protein-calorie malnutrition, severe   Acute respiratory failure   Encephalopathy acute   Cardiac arrest  #Shock due to perihepatic hemorrhage - Patient is s/p hepatic arterial embolization by IR. Vital signs are currently stable. Hgb stable as below. -CBC in AM   Hemoglobin  Date Value Range Status  06/09/2013 12.2* 13.0 - 17.0 g/dL Final  96/09/5407 81.1* 13.0 - 17.0 g/dL Final  91/47/8295 62.1* 13.0 - 17.0 g/dL Final  30/86/5784 69.6* 13.0 - 17.0 g/dL Final  29/52/8413 24.4* 13.0 - 17.0 g/dL Final    #Multiple hepatic masses - Etiology unknown. Pt with elevated liver enzymes which have trended down.  MELD score: 15.  Also with sequela of chronic liver disease including hypoalbuminemia, thrombocytopenia, hyperbilirubinemia, and prolonged PT. Pt with a h/o hepatitis C-possible HCC? AFP 12/22: 670.9. According to the literature, it is generally accepted that serum levels greater than 500 mcg/L in a high-risk patient is diagnostic of HCC. Palliative care consulted (Dr.Taylor) by ICU. Pt and family would like to pursue a definitive tissue diagnosis.  Discussed case with oncology (Dr. Arbutus Ped) who advised getting a biopsy would need to be the next step before having oncology involved. GI consulted and does not want to pursue the EUS for biopsy. Discussed with Dr. Fredia Sorrow of IR, who reviewed the images  and felt that percutaneous liver biopsy is a possibility. Recommended checking PT/INR: PT 14.0, INR 1.10 (wnl). - IR to re-evaluate patient today, tentative plan is for percutaneous liver biopsy Monday - Follow up CEA, 19-9 > pending  #Hypokalemia-  Improving  as below. - CMP in AM - Replete as needed  Potassium  Date Value Range Status  06/09/2013 3.3* 3.5 - 5.1 mEq/L Final  06/08/2013 3.2* 3.5 - 5.1 mEq/L Final     DELTA CHECK NOTED  06/07/2013 2.6* 3.5 - 5.1 mEq/L Final     DELTA CHECK NOTED     CRITICAL RESULT CALLED TO, READ BACK BY AND VERIFIED WITH:     KIANG J,RN 06/07/13 0716 WAYK  06/06/2013 3.8  3.5 - 5.1 mEq/L Final  06/05/2013 3.2* 3.5 - 5.1 mEq/L Final    #Adrenal insufficiency?- 06/01/13 cortisol: 16.1 which is wnl.  - Taper dose, will reduce to 20mg  qd tomorrow  #Hyperglycemia - Resolved.   - CBG AC and QHS - d/c SSI-pt CBG's are only slightly elevated   #Scrotal edema-  -continue scrotal support  #Severe malnutrition in the context of chronic illness- nutrition consulted and recommended ensure complete po bid which was ordered -appreciate RD recommendations   #Dispo- Disposition is deferred at this time, awaiting IR input.  Anticipated discharge in approximately 1-2 days.    LOS: 10 days   Vivi Barrack, MD PGY-1, Internal Medicine  [phone] 06/09/2013, 10:09 AM

## 2013-06-09 NOTE — Evaluation (Signed)
Physical Therapy Evaluation Patient Details Name: Micheal Daniel MRN: [id number] DOB: 1955-12-02 Today's Date: 06/09/2013 Time: 8416-6063 PT Time Calculation (min): 15 min  PT Assessment / Plan / Recommendation History of Present Illness  [name] is a 57 yo man with h/o cholelithiasis who presented to [facility] ED with complaints of "sore", intermittent, worsening, non-radiating RUQ abdominal pain x 24h.  Patient with liver mets - unsure of primary.    Clinical Impression  Patient is at supervision level with all functional mobility.  No acute PT needs identified - PT will sign off.  Encouraged patient to ambulate with nursing/family in hallway 2x/day.    PT Assessment  Patent does not need any further PT services    Follow Up Recommendations  No PT follow up;Supervision - Intermittent    Does the patient have the potential to tolerate intense rehabilitation      Barriers to Discharge        Equipment Recommendations  None recommended by PT    Recommendations for Other Services     Frequency      Precautions / Restrictions Precautions Precautions: None Restrictions Weight Bearing Restrictions: No   Pertinent Vitals/Pain       Mobility  Bed Mobility Bed Mobility: Not assessed Transfers Transfers: Sit to Stand;Stand to Sit Sit to Stand: 6: Modified independent (Device/Increase time);With upper extremity assist;With armrests;From chair/3-in-1 Stand to Sit: 6: Modified independent (Device/Increase time);With upper extremity assist;With armrests;To chair/3-in-1 Details for Transfer Assistance: No cues or assist needed.  Good balance in standing. Ambulation/Gait Ambulation/Gait Assistance: 5: Supervision Ambulation Distance (Feet): 180 Feet Assistive device: Rolling walker;None Ambulation/Gait Assistance Details: Patient ambulated 57' with RW with min weight on UE's.  Patient then ambulated 22' with no assistive device with supervision for safety.  Patient with  good balance with gait. Gait Pattern: Within Functional Limits Gait velocity: [facility]        PT Goals(Current goals can be found in the care plan section)  N/A  Visit Information  Last PT Received On: 06/09/13 Assistance Needed: +1 History of Present Illness: Mr. [name] is a 57 yo man with h/o cholelithiasis who presented to [facility] ED with complaints of "sore", intermittent, worsening, non-radiating RUQ abdominal pain x 24h.  Patient with liver mets - unsure of primary.         Prior Functioning  Home Living Family/patient expects to be discharged to:: Private residence Living Arrangements: Other relatives (Cousin) Available Help at Discharge: Family;Available 24 hours/day Type of Home: House ([place]) Home Access: Stairs to enter Entrance Stairs-Number of Steps: 2 Entrance Stairs-Rails: Right;Left Home Layout: One level Home Equipment: None Prior Function Level of Independence: Independent Communication Communication: No difficulties    Cognition  Cognition Arousal/Alertness: Awake/alert Behavior During Therapy: WFL for tasks assessed/performed Overall Cognitive Status: Within Functional Limits for tasks assessed    Extremity/Trunk Assessment Upper Extremity Assessment Upper Extremity Assessment: Overall WFL for tasks assessed Lower Extremity Assessment Lower Extremity Assessment: Overall WFL for tasks assessed Cervical / Trunk Assessment Cervical / Trunk Assessment: Normal   Balance Balance Balance Assessed: Yes High Level Balance High Level Balance Activites: Turns;Sudden stops;Head turns High Level Balance Comments: No loss of balance with high level balance activities.  End of Session PT - End of Session Equipment Utilized During Treatment: Gait belt Activity Tolerance: Patient tolerated treatment well Patient left: in chair;with call bell/phone within reach Nurse Communication: Mobility status (Encouraged ambulation in hallway with nursing or family)   GP     Beaulah Corin  H 06/09/2013, 5:39 PM Durenda Hurt. Renaldo Fiddler, [facility] Acute Rehab Services Pager [phone]

## 2013-06-09 NOTE — Progress Notes (Signed)
S: Patient c/o mild dysuria and is noted to have gross hematuria. He is not sure whether he has urinary frequency or urgency.   O: VSS General: NAD Abd: soft, +BS, diffuse tenderness to palpation (chronic), mildly distended GU: equivocal left side CTA tenderness. Penis and scrotum swelling.   LE: 3+ pitting edema.   Results for CAN, LUCCI (MRN [id number]) as of 06/09/2013 19:29  Ref. Range 06/09/2013 16:43  Color, Urine Latest Range: YELLOW  ORANGE (A)  APPearance Latest Range: CLEAR  CLOUDY (A)  Specific Gravity, Urine Latest Range: 1.005-1.030  1.019  pH Latest Range: 5.0-8.0  6.0  Glucose Latest Range: NEGATIVE mg/dL NEGATIVE  Bilirubin Urine Latest Range: NEGATIVE  SMALL (A)  Ketones, ur Latest Range: NEGATIVE mg/dL NEGATIVE  Protein Latest Range: NEGATIVE mg/dL 30 (A)  Urobilinogen, UA Latest Range: 0.0-1.0 mg/dL 1.0  Nitrite Latest Range: NEGATIVE  POSITIVE (A)  Leukocytes, UA Latest Range: NEGATIVE  LARGE (A)  Hgb urine dipstick Latest Range: NEGATIVE  MODERATE (A)  WBC, UA Latest Range: <3 WBC/hpf TOO NUMEROUS TO COUNT  RBC / HPF Latest Range: <3 RBC/hpf 7-10  Squamous Epithelial / LPF Latest Range: RARE  FEW (A)  Bacteria, UA Latest Range: RARE  MANY (A)   Assessment:  The clinical manifestation is suggestive of urinary tract infection/possible pyelonephritis likely associated with the indwelling Foley catheter while he was treated in ICU. His UTI/pyonephritis may be a component of trending-up WBC count.  Plan: - Urine culture is pending - Will order Blood culture given his trending- up WBC count - Ceftriaxone daily

## 2013-06-10 ENCOUNTER — Inpatient Hospital Stay (HOSPITAL_COMMUNITY): Payer: Medicaid Other

## 2013-06-10 DIAGNOSIS — C7951 Secondary malignant neoplasm of bone: Secondary | ICD-10-CM

## 2013-06-10 DIAGNOSIS — N10 Acute tubulo-interstitial nephritis: Secondary | ICD-10-CM | POA: Diagnosis present

## 2013-06-10 DIAGNOSIS — C801 Malignant (primary) neoplasm, unspecified: Secondary | ICD-10-CM

## 2013-06-10 DIAGNOSIS — C787 Secondary malignant neoplasm of liver and intrahepatic bile duct: Secondary | ICD-10-CM

## 2013-06-10 LAB — BASIC METABOLIC PANEL
Chloride: 113 mEq/L — ABNORMAL HIGH (ref 96–112)
GFR calc Af Amer: >90 mL/min (ref >90)
GFR calc non Af Amer: >90 mL/min (ref >90)
Potassium: 3.4 mEq/L — ABNORMAL LOW (ref 3.5–5.1)

## 2013-06-10 LAB — GLUCOSE, CAPILLARY
Glucose-Capillary: 92 mg/dL (ref 70–99)
Glucose-Capillary: 120 mg/dL — ABNORMAL HIGH (ref 70–99)

## 2013-06-10 LAB — CBC
MCHC: 33.1 g/dL (ref 30.0–36.0)
Platelets: 246 10*3/uL (ref 150–400)
RDW: 18.3 % — ABNORMAL HIGH (ref 11.5–15.5)
WBC: 20 10*3/uL — ABNORMAL HIGH (ref 4.0–10.5)

## 2013-06-10 LAB — CEA: CEA: 0.7 ng/mL (ref 0.0–5.0)

## 2013-06-10 LAB — CANCER ANTIGEN 19-9: CA 19-9: 96.2 U/mL — ABNORMAL HIGH (ref <35.0)

## 2013-06-10 MED ORDER — FUROSEMIDE 10 MG/ML IJ SOLN
40.0000 mg | Freq: Every day | INTRAMUSCULAR | Status: DC
  Administered 2013-06-10 – 2013-06-15 (×6): 40 mg via INTRAVENOUS
  Filled 2013-06-10 (×6): qty 4

## 2013-06-10 MED ORDER — SPIRONOLACTONE 100 MG PO TABS
100.0000 mg | ORAL_TABLET | Freq: Every day | ORAL | Status: DC
  Administered 2013-06-10 – 2013-06-17 (×8): 100 mg via ORAL
  Filled 2013-06-10 (×8): qty 1

## 2013-06-10 MED ORDER — HYDROCORTISONE 20 MG PO TABS
20.0000 mg | ORAL_TABLET | Freq: Every day | ORAL | Status: DC
  Administered 2013-06-10 – 2013-06-11 (×2): 20 mg via ORAL
  Filled 2013-06-10 (×3): qty 1

## 2013-06-10 NOTE — Consult Note (Signed)
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  Subsequent imaging of the proper hepatic artery demonstrates active extravasation of contrast within the right lobe of the liver corresponding to the abnormality noted on CT.  Subsequent imaging demonstrates both Gel-Foam and coil embolization of the suspect vessel. Repeat angiography demonstrates resolution of the active extravasation.  Subsequent injection of other 3rd order vessels demonstrates no other source of active extravasation.  IMPRESSION: Diagnostic hepatic arterial angiography confirms active extravasation of contrast within the right lobe of the liver. The source vessel was selectively embolize utilizing Gel-Foam slurry and a single platinum coil. This was successful in eliminating active extravasation of bleeding from the liver parenchyma.  : CONTRAST:  100 cc Omnipaque 300   Electronically Signed   By: Maryclare Bean M.D.   On: 06/01/2013 12:52      A/P: 56 y.o. male with a history of ETOH And Hep CV Ab positive, admitted with RUQ pain found to have innumerable liver lesions, along with paraesophageal involvement, ascites and omentum, porta hepatis, and  heterogeneous appearance of the bilateral iliac bones.Status complicated by VT/arrest due to acute hemorrhage from the  subcapsular lesion,requiring embolization. Due to the risk for bleeding  patient had initially decided against biopsy, now wishing to have IR consult to determine if he is a candidate for procedure. thus, no diagnosis from tissue has been obtained to date.. Palliative care following Micheal Daniel.Available tumor marker data shows elevation of CA 19.9 and AFP, rule out Liver primary versus other malignance. We were kindly requested to consult  on this nice patient.  Dr. Myna Hidalgo   is to see the patient following this consult with recommendations for further workup studies. An addendum to this note is to be written. Thank you for the referral.  Marcos Eke, PA-C 06/10/2013 11:06 AM   ADDENDUM:     I saw and examined Micheal Daniel.  I with the above assessment. The real problem that we have is making a tissue diagnosis. I was not aware that his CA 19-9  is also elevated. To me, the appearance on the CT scan looks atypical for hepatocellular carcinoma. This is to be disease outside of the liver. 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 He has some ascites.  Of note, on an ultrasound that he had done in June, he had liver abnormalities that were suspicious.  His performance status is not that great. I think that if we identify a primary site, our chances of treating him would be limited unless he improves his status. I did send off a prealbumin on him.  I think that we need to try to get a tissue diagnosis. It has ascites, and possibly we can see about a paracentesis. 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  I wonder if and endoscopic ultrasound could get what we needed.  Again, she realizes and understands that no matter what he has it cannot be cured.  I think even with treatment, the prognosis here will easily be less than 6 months. I totally agree with having palliative care involved.  Again, we are really a need of a tissue diagnosis here. At least with a tissue diagnosis, we can give  some type her prognosis of to Micheal Daniel and I think this will help him and his family.  The way suspect that given his hepatitis C that this likely would be hepatocellular carcinoma. However, I think we really need to be confident with a tissue diagnosis we discussed prognosis with he and his family.  I would try to get as much done with him as an inpatient given his overall performance status (ECOG 2-3).  I will definitely pray hard for him and his family. He does seem like a real nice Michelle Piper.  We will follow along and provide further input once we have some more information.  Lawerance Sabal 1: 5-7

## 2013-06-10 NOTE — Progress Notes (Signed)
Patient ZO:[id number] Albea      DOB: [birth year]      UJW:[id number]  We have been shadowing with you since consult.  Will revisit with patient to reassess goals.    Brionne Mertz L. Ladona Ridgel, MD MBA The Palliative Medicine Team at [facility] Phone: [phone] Pager: [phone]

## 2013-06-10 NOTE — Progress Notes (Signed)
Due to urgency,pt. incont. of urine,unable to obtain accurate urinary output

## 2013-06-10 NOTE — Evaluation (Signed)
Occupational Therapy Evaluation Patient Details Name: Micheal Daniel MRN: [id number] DOB: 10/01/1955 Today's Date: 06/10/2013 Time: 4098-1191 OT Time Calculation (min): 18 min  OT Assessment / Plan / Recommendation History of present illness [name] is a 57 yo man with h/o cholelithiasis who presented to [facility] ED with complaints of "sore", intermittent, worsening, non-radiating RUQ abdominal pain x 24h.  Patient with liver mets - unsure of primary.     Clinical Impression   Pt presents with below problem list. Pt independent with ADLs, PTA. Pt will benefit from acute OT to increase independence prior to d/c.     OT Assessment  Patient needs continued OT Services    Follow Up Recommendations  No OT follow up;Supervision/Assistance - 24 hour    Barriers to Discharge      Equipment Recommendations  None recommended by OT    Recommendations for Other Services    Frequency  Min 2X/week    Precautions / Restrictions Precautions Precautions: None Restrictions Weight Bearing Restrictions: No   Pertinent Vitals/Pain No pain reported.     ADL  Grooming: Set up Where Assessed - Grooming: Supported sitting Lower Body Bathing: Min guard Where Assessed - Lower Body Bathing: Supported sit to stand Upper Body Dressing: Set up Where Assessed - Upper Body Dressing: Unsupported sitting Lower Body Dressing: Min guard Where Assessed - Lower Body Dressing: Supported sit to Pharmacist, hospital: Supervision/safety Statistician Method: Sit to Barista: Comfort height toilet;Grab bars Tub/Shower Transfer: Minimal Radiation protection practitioner Method: Science writer: Other (comment) (practiced stepping over) Equipment Used: Gait belt;Rolling walker Transfers/Ambulation Related to ADLs: Supervision for ambulation (walked with and without walker). Supervision/Mod I for sit <> stand transfers. ADL Comments: Explained that long  handled sponge may make LB bathing easier. Pt able to don/doff socks, but appeared difficult. Min A for balance for tub transfer. Educated on sitting to bathe and getting dressed.  Educated on energy conservation techniques-pt breathing heavily during session and states he has been getting fatigued. Educated on importance of moving and doing things to help increase activity tolerance but also explained energy conservation techniques he can utilize. Pt simulated LB bathing while standing and had decreased balance. Educated that elevating LE's helps decrease edema.    OT Diagnosis: Other (comment) (decreased activity tolerance and balance)  OT Problem List: Decreased strength;Decreased activity tolerance;Increased edema;Impaired balance (sitting and/or standing);Decreased knowledge of precautions;Decreased knowledge of use of DME or AE OT Treatment Interventions: Self-care/ADL training;Energy conservation;DME and/or AE instruction;Therapeutic activities;Patient/family education;Balance training   OT Goals(Current goals can be found in the care plan section) Acute Rehab OT Goals Patient Stated Goal: not stated OT Goal Formulation: With patient Time For Goal Achievement: 06/17/13 Potential to Achieve Goals: Good ADL Goals Pt Will Perform Upper Body Bathing: with modified independence;sitting Pt Will Perform Lower Body Bathing: with modified independence;sit to/from stand Additional ADL Goal #1: Pt will independently verbalize 3/3 energy conservation techniques and utilize during ADLs.   Visit Information  Last OT Received On: 06/10/13 Assistance Needed: +1 History of Present Illness: [name] is a 57 yo man with h/o cholelithiasis who presented to [facility] ED with complaints of "sore", intermittent, worsening, non-radiating RUQ abdominal pain x 24h.  Patient with liver mets - unsure of primary.         Prior Functioning     Home Living Family/patient expects to be discharged to::  Private residence Living Arrangements: Other relatives (cousin) Available Help at Discharge: Family;Available 24 hours/day  Type of Home: House (duplex) Home Access: Stairs to enter Entrance Stairs-Number of Steps: 2 Entrance Stairs-Rails: Right;Left Home Layout: One level Home Equipment: Shower seat Prior Function Level of Independence: Independent Communication Communication: No difficulties         Vision/Perception     Cognition  Cognition Arousal/Alertness: Awake/alert Behavior During Therapy: WFL for tasks assessed/performed Overall Cognitive Status: Within Functional Limits for tasks assessed    Extremity/Trunk Assessment Upper Extremity Assessment Upper Extremity Assessment: Overall WFL for tasks assessed Lower Extremity Assessment Lower Extremity Assessment: Defer to PT evaluation     Mobility Bed Mobility Bed Mobility: Not assessed Transfers Transfers: Sit to Stand;Stand to Sit Sit to Stand: 5: Supervision;6: Modified independent (Device/Increase time);From chair/3-in-1;From toilet Stand to Sit: 5: Supervision;6: Modified independent (Device/Increase time);To chair/3-in-1;To toilet Details for Transfer Assistance: Cues for technique when using walker for toilet transfer.     Exercise     Balance     End of Session OT - End of Session Equipment Utilized During Treatment: Gait belt;Rolling walker Activity Tolerance: Patient tolerated treatment well Patient left: in chair;with call bell/phone within reach;with family/visitor present  GO     Earlie Raveling OTR/L [phone] 06/10/2013, 2:52 PM

## 2013-06-10 NOTE — Progress Notes (Addendum)
Subjective:   Patient seen and examined at the bedside this morning. His sister Dois Davenport is in the room. He is sitting in the recliner when I enter the room. He says he feels well. He is eating fine and denies any chest pain, palpitations, N/V/D/C, or worsened abdominal pain. He still has occasional SOB and cough. His dysuria and flank pain have improved with the antibiotics.  He wanted to talk more about the risks and benefits of the IR-guided biopsy of his liver. I explained that the main risk is bleeding of the liver, which he nearly died from earlier in his hospital stay. After thinking it over and talking to his family, he informed me that he did not want to pursue the biopsy due to the risks.   Objective:   Vital signs in last 24 hours: Filed Vitals:   06/09/13 0500 06/09/13 1547 06/09/13 2157 06/10/13 0510  BP: 174/82 147/97 153/80 141/81  Pulse: 70 57 70 97  Temp: 98.2 F (36.8 C) 98 F (36.7 C) 97.9 F (36.6 C) 99.3 F (37.4 C)  TempSrc: Oral Oral Oral Oral  Resp: 18 18 18 18   Height:      Weight:      SpO2: 93% 97% 91% 97%   Weight change:   Intake/Output Summary (Last 24 hours) at 06/10/13 0853 Last data filed at 06/10/13 1914  Gross per 24 hour  Intake     50 ml  Output     75 ml  Net    -25 ml    Physical Exam: Constitutional: Vital signs reviewed.  Patient is sitting in a recliner in no acute distress and cooperative with exam.   Head: Normocephalic and atraumatic Eyes: PERRL, EOMI, +scleral icterus  Neck: Supple, Trachea midline Cardiovascular: irregular rhythm, regular rate, S1, S2 present, no MRG, DP 2+ b/l; 3+ pitting edema up to the thighs b/l Pulmonary/Chest: normal respiratory effort, soft crackles at right lung base; no gynecomastia Abdominal: Soft. +BS, diffuse tenderness to palpation, mildly distended Musculoskeletal: No joint deformities, erythema, or stiffness Genitourinary: Scrotal edema. Neurological: A&O x3, cranial nerve II-XII are  grossly intact, moving all extremities  Skin: Warm, dry and intact. No rash Psychiatric: Normal mood and affect.   Lab Results:  BMP:  Recent Labs Lab 06/07/13 0545  06/09/13 0605 06/10/13 0355  NA 149*  < > 149* 145  K 2.6*  < > 3.3* 3.4*  CL 112  < > 115* 113*  CO2 24  < > 24 24  GLUCOSE 99  < > 103* 105*  BUN 27*  < > 27* 26*  CREATININE 0.87  < > 0.89 0.89  CALCIUM 8.2*  < > 8.5 8.4  MG 2.4  --   --   --   < > = values in this interval not displayed.  CBC:  Recent Labs Lab 06/09/13 0605 06/10/13 0355  WBC 21.6* 20.0*  HGB 12.2* 12.0*  HCT 37.0* 36.3*  MCV 91.4 91.4  PLT 211 246    Coagulation:  Recent Labs Lab 06/09/13 0605  LABPROT 14.0  INR 1.10    CBG:            Recent Labs Lab 06/08/13 2112 06/09/13 0819 06/09/13 1309 06/09/13 1714 06/09/13 2156 06/10/13 0741  GLUCAP 125* 98 95 129* 112* 92          LFTs:  Recent Labs Lab 06/07/13 0545 06/08/13 0450  AST 185* 187*  ALT 82* 87*  ALKPHOS 436* 458*  BILITOT 2.8* 3.1*  PROT 6.6 6.9  ALBUMIN 1.7* 1.8*    Cardiac Enzymes: No results found for this basename: CKTOTAL, CKMB, CKMBINDEX, TROPONINI,  in the last 168 hours Lab Results  Component Value Date   CKTOTAL 232 05/31/2013   CKMB 3.9 05/31/2013   TROPONINI <0.30 06/01/2013    EKG:  Date/Time:  Thursday May 30 2013 09:46:47 EST Ventricular Rate:  82 PR Interval:  138 QRS Duration: 72 QT Interval:  388 QTC Calculation: 453 R Axis:   68 Text Interpretation:  Sinus rhythm Normal ECG No significant change since last tracing Confirmed by Bebe Shaggy  MD, DONALD [phone]) on 05/30/2013 10:02:53 AM   Micro Results: No results found for this or any previous visit (from the past 240 hour(s)).  Medications:  Scheduled Meds: . cefTRIAXone (ROCEPHIN)  IV  2 g Intravenous Q24H  . feeding supplement (ENSURE COMPLETE)  237 mL Oral BID BM  . folic acid  1 mg Oral Daily  . hydrocortisone  20 mg Oral Daily  . multivitamin with  minerals  1 tablet Oral Daily  . potassium chloride SA  40 mEq Oral BID  . sodium chloride  3 mL Intravenous Q12H  . thiamine  100 mg Oral Daily   Continuous Infusions:  PRN Meds:.sodium chloride, diphenhydrAMINE, sodium chloride  Antibiotics: Anti-infectives   Start     Dose/Rate Route Frequency Ordered Stop   06/09/13 2000  cefTRIAXone (ROCEPHIN) 2 g in dextrose 5 % 50 mL IVPB     2 g 100 mL/hr over 30 Minutes Intravenous Every 24 hours 06/09/13 1856       Antibiotics Given (last 72 hours)   Date/Time Action Medication Dose Rate   06/09/13 2202 Given  [bld cultures just drawn]   cefTRIAXone (ROCEPHIN) 2 g in dextrose 5 % 50 mL IVPB 2 g 100 mL/hr       Day of Hospitalization:  11 Consults: Treatment Team:  Palliative Steva Ready, MD  Assessment/Plan:   Principal Problem:   RUQ abdominal pain Active Problems:   Anemia   Mass of multiple sites of liver   Transaminitis   Protein calorie malnutrition   Elevated INR   Hyponatremia   Protein-calorie malnutrition, severe   Acute respiratory failure   Encephalopathy acute   Cardiac arrest  #Shock due to perihepatic hemorrhage - Patient is s/p hepatic arterial embolization by IR. Vital signs are currently stable. Hgb stable as below. -CBC in AM   Hemoglobin  Date Value Range Status  06/10/2013 12.0* 13.0 - 17.0 g/dL Final  96/09/5407 81.1* 13.0 - 17.0 g/dL Final  91/47/8295 62.1* 13.0 - 17.0 g/dL Final  30/86/5784 69.6* 13.0 - 17.0 g/dL Final  29/52/8413 24.4* 13.0 - 17.0 g/dL Final    #Multiple hepatic masses - Etiology unknown. Pt with elevated liver enzymes which have trended down.  MELD score: 15.  Also with sequela of chronic liver disease including hypoalbuminemia, thrombocytopenia, hyperbilirubinemia, and prolonged PT. Pt with a h/o hepatitis C and AFP 12/22: 670.9. According to the literature, it is generally accepted that serum levels greater than 500 mcg/L in a high-risk patient is diagnostic  of HCC. Ca 19-9 = 96.2 (high) and CEA = 0.7 (wnl). Palliative care consulted (Dr.Taylor) by ICU. Initially pt and family wanted to pursue a definitive tissue diagnosis. GI did not want to pursue an EUS biopsy, so we were going to get IR involved today for possible percutaneous liver biopsy. However, after ~30 min of discussion with family about  the risks and benefits of the procedure, today patient decided he did not want to pursue the biopsy. I spoke with IR about the change and they are aware and will not see the patient. - Consult to medical oncology Myna Hidalgo) for management options going forward - Resume regular diet - Social work/CM consult in preparation for discharge as patient has no insurance  #UTI - Patient noted to have mild dysuria and gross hematuria yesterday afternoon. UA was notable for + nitrites and + LE, many bacteria, few squams. Presentation suggestive of urinary tract infection/possible pyelonephritis likely associated with the indwelling Foley catheter while he was treated in ICU. This infection may be a component of trending-up WBC count (see below), though he is also on steroids. - Follow up urine culture > pending - Follow up blood culture > pending - Ceftriaxone 2g IV q24hr  WBC  Date Value Range Status  06/10/2013 20.0* 4.0 - 10.5 K/uL Final  06/09/2013 21.6* 4.0 - 10.5 K/uL Final  06/08/2013 20.8* 4.0 - 10.5 K/uL Final  06/07/2013 19.2* 4.0 - 10.5 K/uL Final  06/06/2013 19.3* 4.0 - 10.5 K/uL Final    #Hypokalemia-  Improving as below. - CMP in AM - Replete as needed  Potassium  Date Value Range Status  06/10/2013 3.4* 3.5 - 5.1 mEq/L Final  06/09/2013 3.3* 3.5 - 5.1 mEq/L Final  06/08/2013 3.2* 3.5 - 5.1 mEq/L Final     DELTA CHECK NOTED  06/07/2013 2.6* 3.5 - 5.1 mEq/L Final     DELTA CHECK NOTED     CRITICAL RESULT CALLED TO, READ BACK BY AND VERIFIED WITH:     KIANG J,RN 06/07/13 0716 WAYK  06/06/2013 3.8  3.5 - 5.1 mEq/L Final    #Adrenal  insufficiency?- 06/01/13 cortisol: 16.1 which is wnl. Tapering steroids. - Will reduce Cortef to 20mg  qd today  #Hyperglycemia - Resolved.   - CBG AC and QHS - d/c SSI-pt CBG's are only slightly elevated   #Scrotal edema - continue scrotal support  #Severe malnutrition in the context of chronic illness- nutrition consulted and recommended ensure complete po bid which was ordered -appreciate RD recommendations  #Code status - FULL CODE. Re-confirmed this today with the patient and his family.   #Dispo- Disposition is deferred at this time.  Anticipated discharge in approximately 1-2 days.    LOS: 11 days   Vivi Barrack, MD PGY-1, Internal Medicine  [phone] 06/10/2013, 8:53 AM

## 2013-06-11 ENCOUNTER — Inpatient Hospital Stay (HOSPITAL_COMMUNITY): Payer: Medicaid Other

## 2013-06-11 DIAGNOSIS — N390 Urinary tract infection, site not specified: Secondary | ICD-10-CM

## 2013-06-11 DIAGNOSIS — E43 Unspecified severe protein-calorie malnutrition: Secondary | ICD-10-CM

## 2013-06-11 LAB — BASIC METABOLIC PANEL
BUN: 26 mg/dL — ABNORMAL HIGH (ref 6–23)
CO2: 26 mEq/L (ref 19–32)
CO2: 25 mEq/L (ref 19–32)
Calcium: 9.1 mg/dL (ref 8.4–10.5)
Chloride: 111 mEq/L (ref 96–112)
Creatinine, Ser: 1.13 mg/dL (ref 0.50–1.35)
Creatinine, Ser: 1.1 mg/dL (ref 0.50–1.35)
Glucose, Bld: 88 mg/dL (ref 70–99)
Sodium: 152 mEq/L — ABNORMAL HIGH (ref 137–147)

## 2013-06-11 LAB — URINE CULTURE

## 2013-06-11 LAB — GLUCOSE, CAPILLARY: Glucose-Capillary: 77 mg/dL (ref 70–99)

## 2013-06-11 LAB — PREALBUMIN: Prealbumin: 7.7 mg/dL — ABNORMAL LOW (ref 17.0–34.0)

## 2013-06-11 LAB — CBC
Hemoglobin: 12.1 g/dL — ABNORMAL LOW (ref 13.0–17.0)
MCH: 28.9 pg (ref 26.0–34.0)
MCV: 91.9 fL (ref 78.0–100.0)
RBC: 4.18 MIL/uL — ABNORMAL LOW (ref 4.22–5.81)
RDW: 18.7 % — ABNORMAL HIGH (ref 11.5–15.5)

## 2013-06-11 LAB — BODY FLUID CELL COUNT WITH DIFFERENTIAL
Neutrophil Count, Fluid: 46 % — ABNORMAL HIGH (ref 0–25)
Total Nucleated Cell Count, Fluid: 5560 cu mm — ABNORMAL HIGH (ref 0–1000)

## 2013-06-11 LAB — LACTATE DEHYDROGENASE, PLEURAL OR PERITONEAL FLUID: LD, Fluid: 2509 U/L — ABNORMAL HIGH (ref 3–23)

## 2013-06-11 LAB — PROTEIN, BODY FLUID: Total protein, fluid: 3.2 g/dL

## 2013-06-11 MED ORDER — ALBUMIN HUMAN 25 % IV SOLN: 25.0000 g | Freq: Three times a day (TID) | INTRAVENOUS | Status: DC

## 2013-06-11 MED ORDER — DM-GUAIFENESIN ER 30-600 MG PO TB12
1.0000 | ORAL_TABLET | Freq: Two times a day (BID) | ORAL | Status: DC
  Administered 2013-06-11 – 2013-06-14 (×7): 1 via ORAL
  Filled 2013-06-11 (×9): qty 1

## 2013-06-11 MED ORDER — ALBUMIN HUMAN 25 % IV SOLN
25.0000 g | Freq: Two times a day (BID) | INTRAVENOUS | Status: AC
  Administered 2013-06-11 – 2013-06-12 (×3): 25 g via INTRAVENOUS
  Administered 2013-06-13: 12.5 g via INTRAVENOUS
  Administered 2013-06-13: 25 g via INTRAVENOUS
  Administered 2013-06-13: 12.5 g via INTRAVENOUS
  Administered 2013-06-14: 25 g via INTRAVENOUS
  Filled 2013-06-11 (×12): qty 100

## 2013-06-11 MED ORDER — ALBUMIN HUMAN 25 % IV SOLN
25.0000 g | Freq: Once | INTRAVENOUS | Status: DC
  Filled 2013-06-11: qty 100

## 2013-06-11 NOTE — Progress Notes (Signed)
Subjective:   Patient seen and examined at the bedside this morning. He is lying down in his recliner. He says he had trouble sleeping last night because he felt short of breath. He has still been coughing, producing clear sputum. His abdominal pain is better than it was yesterday, and his belly feels less tense. He denies fevers and chills. We asked him about his hoarse voice. He says this is new since being intubated.  He was noted to be tachycardic. A temperature was checked at the bedside, 98.9 degrees.   Objective:   Vital signs in last 24 hours: Filed Vitals:   06/10/13 1634 06/10/13 2110 06/11/13 0459 06/11/13 0523  BP:  147/72  149/83  Pulse: 106 88  96  Temp:  98.8 F (37.1 C)  99.3 F (37.4 C)  TempSrc:  Oral  Oral  Resp:  18  18  Height:      Weight:   151 lb 0.2 oz (68.5 kg)   SpO2: 99% 98%  96%   Weight change:   Intake/Output Summary (Last 24 hours) at 06/11/13 4098 Last data filed at 06/11/13 0500  Gross per 24 hour  Intake    243 ml  Output    251 ml  Net     -8 ml    Physical Exam: Constitutional: Vital signs reviewed.  Patient is sitting in a recliner in no acute distress and cooperative with exam.   Head: Normocephalic and atraumatic Eyes: PERRL, EOMI, +scleral icterus  Neck: Supple, Trachea midline Cardiovascular: Regular rhythm, tachycardic to 110, S1, S2 present, no MRG, DP 2+ b/l; still with 3+ pitting edema up to the thighs b/l Pulmonary/Chest: normal respiratory effort, soft crackles at right lung base improved from yesterday, SpO2 96% on room air; no gynecomastia Abdominal: Soft. +BS, only mildly tender to palpation, mildly distended Musculoskeletal: No joint deformities, erythema, or stiffness Genitourinary: Scrotal edema appears to be improving Neurological: A&O x3, cranial nerve II-XII are grossly intact, moving all extremities, hoarse voice Skin: Warm, dry and intact. No rash Psychiatric: Normal mood and affect.   Lab  Results:  BMP:  Recent Labs Lab 06/07/13 0545  06/09/13 0605 06/10/13 0355  NA 149*  < > 149* 145  K 2.6*  < > 3.3* 3.4*  CL 112  < > 115* 113*  CO2 24  < > 24 24  GLUCOSE 99  < > 103* 105*  BUN 27*  < > 27* 26*  CREATININE 0.87  < > 0.89 0.89  CALCIUM 8.2*  < > 8.5 8.4  MG 2.4  --   --  2.2  < > = values in this interval not displayed.  CBC:  Recent Labs Lab 06/10/13 0355 06/11/13 0740  WBC 20.0* 17.1*  HGB 12.0* 12.1*  HCT 36.3* 38.4*  MCV 91.4 91.9  PLT 246 286    Coagulation:  Recent Labs Lab 06/09/13 0605  LABPROT 14.0  INR 1.10    CBG:            Recent Labs Lab 06/09/13 2156 06/10/13 0741 06/10/13 1138 06/10/13 1725 06/10/13 2106 06/11/13 0803  GLUCAP 112* 92 120* 143* 112* 77          LFTs:  Recent Labs Lab 06/07/13 0545 06/08/13 0450  AST 185* 187*  ALT 82* 87*  ALKPHOS 436* 458*  BILITOT 2.8* 3.1*  PROT 6.6 6.9  ALBUMIN 1.7* 1.8*    Cardiac Enzymes: No results found for this basename: CKTOTAL, CKMB, CKMBINDEX,  TROPONINI,  in the last 168 hours Lab Results  Component Value Date   CKTOTAL 232 05/31/2013   CKMB 3.9 05/31/2013   TROPONINI <0.30 06/01/2013    EKG:  Date/Time:  Thursday May 30 2013 09:46:47 EST Ventricular Rate:  82 PR Interval:  138 QRS Duration: 72 QT Interval:  388 QTC Calculation: 453 R Axis:   68 Text Interpretation:  Sinus rhythm Normal ECG No significant change since last tracing Confirmed by Bebe Shaggy  MD, DONALD [phone]) on 05/30/2013 10:02:53 AM   Micro Results: Recent Results (from the past 240 hour(s))  URINE CULTURE     Status: None   Collection Time    06/09/13  4:43 PM      Result Value Range Status   Specimen Description URINE, RANDOM   Final   Special Requests NONE   Final   Culture  Setup Time     Final   Value: 06/09/2013 22:56     Performed at [facility] Count     Final   Value: >=100,000 COLONIES/ML     Performed at [facility]   Culture      Final   Value: ESCHERICHIA COLI     Performed at [facility]   Report Status 06/11/2013 FINAL   Final   Organism ID, Bacteria ESCHERICHIA COLI   Final    Medications:  Scheduled Meds: . cefTRIAXone (ROCEPHIN)  IV  2 g Intravenous Q24H  . dextromethorphan-guaiFENesin  1 tablet Oral BID  . feeding supplement (ENSURE COMPLETE)  237 mL Oral BID BM  . folic acid  1 mg Oral Daily  . furosemide  40 mg Intravenous Daily  . hydrocortisone  20 mg Oral Daily  . multivitamin with minerals  1 tablet Oral Daily  . potassium chloride SA  40 mEq Oral BID  . sodium chloride  3 mL Intravenous Q12H  . spironolactone  100 mg Oral Daily  . thiamine  100 mg Oral Daily   Continuous Infusions:  PRN Meds:.sodium chloride, diphenhydrAMINE, sodium chloride  Antibiotics: Anti-infectives   Start     Dose/Rate Route Frequency Ordered Stop   06/09/13 2000  cefTRIAXone (ROCEPHIN) 2 g in dextrose 5 % 50 mL IVPB     2 g 100 mL/hr over 30 Minutes Intravenous Every 24 hours 06/09/13 1856       Antibiotics Given (last 72 hours)   Date/Time Action Medication Dose Rate   06/09/13 2202 Given  [bld cultures just drawn]   cefTRIAXone (ROCEPHIN) 2 g in dextrose 5 % 50 mL IVPB 2 g 100 mL/hr   06/10/13 1955 Given   cefTRIAXone (ROCEPHIN) 2 g in dextrose 5 % 50 mL IVPB 2 g 100 mL/hr       Day of Hospitalization:  12 Consults: Treatment Team:  Palliative Steva Ready, MD  Assessment/Plan:   #Multiple hepatic masses - Etiology unknown. MELD score: 15. Pt with a h/o hepatitis C and his AFP was significantly elevated at 670.9. Ca 19-9 = 96.2 (also high). 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 However, CT appearance looks atypical for this. Esophageal primary was initially suspected, but EGD was negative. Our big issue right now is making a tissue diagnosis to guide prognosis, given his recent near-fatal perihepatic bleed. GI did not feel comfortable pursuing an EUS biopsy of the liver. We were planning to get IR  involved for possible percutaneous liver biopsy, however after discussion with family about the risks and benefits of the procedure, patient  decided he did not want to pursue a liver biopsy. Medical oncology was called yesterday and recommended pursuing a diagnostic paracentesis vs. omental biopsy to get a tissue diagnosis and avoid sticking the liver. Palliative care has been following (Dr.Taylor) since his time in the ICU and will re-evaluate soon.  - Appreciate very much medical oncology recs - Perform diagnostic paracentesis (likely by IR due to risks) - Send ascitic fluid for cytology, cell count with differential, culture, LDH, protein, pH - NPO for procedure - Continue PT/OT - Transfer to cardiac telemetry floor, 3W or 2W  Hepatic Function Panel     Component Value Date/Time   PROT 6.9 06/08/2013 0450   ALBUMIN 1.8* 06/08/2013 0450   AST 187* 06/08/2013 0450   ALT 87* 06/08/2013 0450   ALKPHOS 458* 06/08/2013 0450   BILITOT 3.1* 06/08/2013 0450    #Weight gain - Patient apparently weighed 103lbs on 12/18. He since has gained about 50lbs (see last 3 weights below). He has scrotal edema and abdominal ascites. - Daily weights - Strict ins and outs (of note, nurse has had trouble with this 2/2 his urinary urgency) - Lasix 40mg  IV daily - Spironolactone 100mg  po daily - Paracentesis today - Continue scrotal support  Filed Weights   06/03/13 0500 06/10/13 1447 06/11/13 0459  Weight: 151 lb 14.4 oz (68.9 kg) 147 lb 7.8 oz (66.9 kg) 151 lb 0.2 oz (68.5 kg)    #Complicated UTI vs. pyelonephritis - Patient noted to have flank pain, mild dysuria, and gross hematuria on 12/18. UA was positive for nitrites and LE, many bacteria, few squams. Presentation suggestive of UTI vs. Pyelonephritis, likely associated with having a Foley catheter while he was in the ICU. Urine culture growing >100K E. Coli, pan-sensitive. - Follow up blood culture > pending - Ceftriaxone 2g IV  q24hr  #Leukocytosis - Trend below. Patient has a UTI/pyelonephritis and has been on systemic steroids, which we are now tapering. On physical exam he continues to have RUQ pain, distention, and some guarding. He is afebrile, AVSS. Patient with cough productive of clear sputum and some crackles noted at right lung base. He is at risk for HCAP since recently intubated. However, CXR was without pneumonia or effusion. - Rule out SBP with diagnostic paracentesis (above) - Continue ceftriaxone, he is on an appropriate regimen for pyelonephritis or SBP - Mucinex for cough  WBC  Date Value Range Status  06/11/2013 17.1* 4.0 - 10.5 K/uL Final  06/10/2013 20.0* 4.0 - 10.5 K/uL Final  06/09/2013 21.6* 4.0 - 10.5 K/uL Final  06/08/2013 20.8* 4.0 - 10.5 K/uL Final  06/07/2013 19.2* 4.0 - 10.5 K/uL Final    #Hoarseness - Patient has suffered from hoarseness and cough since being extubated on 12/23. His swallowing does not seem to be affected as he is tolerating his diet. We would like ENT to see him before he leaves the hospital to rule out vocal cord trauma. - ENT consult placed (Dr. Jenne Pane)  #Shock due to perihepatic hemorrhage, resolved - Patient is s/p VT arrest/Code Blue on 12/20 secondary to hemorrhagic shock from perihepatic hemorrhage from subcapsular lesion. IR performed emergent hepatic/mesentericGel-Foam and coil embolization. Vital signs are currently stable. Hgb stable as below. On 12/27 PT was 14.0, INR 1.10. - Daily CBC  Hemoglobin  Date Value Range Status  06/11/2013 12.1* 13.0 - 17.0 g/dL Final  13/01/6577 46.9* 13.0 - 17.0 g/dL Final  62/95/2841 32.4* 13.0 - 17.0 g/dL Final  40/03/2724 36.6* 13.0 - 17.0 g/dL Final  06/07/2013 11.5* 13.0 - 17.0 g/dL Final    #Hypokalemia-  Improving as below. - Continue K-dur BID - BMP q12h since we have added spironolactone to his regimen and may have to adjust K-dur supplementation  Potassium  Date Value Range Status  06/10/2013 3.4*  3.5 - 5.1 mEq/L Final  06/09/2013 3.3* 3.5 - 5.1 mEq/L Final  06/08/2013 3.2* 3.5 - 5.1 mEq/L Final     DELTA CHECK NOTED  06/07/2013 2.6* 3.5 - 5.1 mEq/L Final     DELTA CHECK NOTED     CRITICAL RESULT CALLED TO, READ BACK BY AND VERIFIED WITH:     Nehemiah Settle J,RN 06/07/13 0716 WAYK  06/06/2013 3.8  3.5 - 5.1 mEq/L Final    #Adrenal insufficiency? - Concern for adrenal insufficiency when he was in the unit. 06/01/13 Cortisol = 16.1, which is wnl. Tapering steroids. - Cortef 20mg  qd, to be tapered off in the next few days  #Severe malnutrition in the context of chronic illness - Nutrition consulted and recommended ensure complete po bid which was ordered. - Appreciate RD recommendations - Follow up prealbumin  #Financial issues - Patient is uninsured and does not have a PCP. He rents a room from a friend. - Financial counselor following to see if patient will qualify for Medicaid  - CM following, have given patient MATCH letter for mediations once we are ready for discharge - Patient to follow up in [facility]  #Code status - FULL CODE. Re-confirmed this today with the patient and his family.   #Dispo- Disposition is deferred at this time.  Anticipated discharge in approximately 1-2 days.    LOS: 12 days   Vivi Barrack, MD PGY-1, Internal Medicine  [phone] 06/11/2013, 9:23 AM

## 2013-06-11 NOTE — Procedures (Signed)
US guided RLQ para Performed by Dr Richarda Overlie  210 cc blood removed Sent for labs by MD  Pt tolerated well

## 2013-06-12 DIAGNOSIS — K652 Spontaneous bacterial peritonitis: Secondary | ICD-10-CM | POA: Diagnosis present

## 2013-06-12 LAB — PH, BODY FLUID: pH, Fluid: 8.5

## 2013-06-12 LAB — HEPATIC FUNCTION PANEL
AST: 218 U/L — ABNORMAL HIGH (ref 0–37)
Albumin: 2.1 g/dL — ABNORMAL LOW (ref 3.5–5.2)
Indirect Bilirubin: 1.5 mg/dL — ABNORMAL HIGH (ref 0.3–0.9)
Total Bilirubin: 5.2 mg/dL — ABNORMAL HIGH (ref 0.3–1.2)
Total Protein: 7 g/dL (ref 6.0–8.3)

## 2013-06-12 LAB — BASIC METABOLIC PANEL
BUN: 25 mg/dL — ABNORMAL HIGH (ref 6–23)
CO2: 24 mEq/L (ref 19–32)
CO2: 26 mEq/L (ref 19–32)
Calcium: 8.6 mg/dL (ref 8.4–10.5)
Calcium: 8.7 mg/dL (ref 8.4–10.5)
Chloride: 114 mEq/L — ABNORMAL HIGH (ref 96–112)
Chloride: 109 mEq/L (ref 96–112)
Creatinine, Ser: 1 mg/dL (ref 0.50–1.35)
Creatinine, Ser: 1 mg/dL (ref 0.50–1.35)
GFR calc Af Amer: >90 mL/min (ref >90)
GFR calc Af Amer: >90 mL/min (ref >90)
Glucose, Bld: 109 mg/dL — ABNORMAL HIGH (ref 70–99)
Potassium: 3.7 mEq/L (ref 3.7–5.3)
Sodium: 147 mEq/L (ref 137–147)

## 2013-06-12 LAB — CBC
HCT: 35.2 % — ABNORMAL LOW (ref 39.0–52.0)
Hemoglobin: 11.5 g/dL — ABNORMAL LOW (ref 13.0–17.0)
MCHC: 32.7 g/dL (ref 30.0–36.0)
MCV: 91.9 fL (ref 78.0–100.0)
RDW: 19 % — ABNORMAL HIGH (ref 11.5–15.5)
WBC: 19.1 10*3/uL — ABNORMAL HIGH (ref 4.0–10.5)

## 2013-06-12 LAB — ALBUMIN, FLUID (OTHER): Albumin, Fluid: 1.2 g/dL

## 2013-06-12 LAB — MRSA PCR SCREENING: MRSA by PCR: NEGATIVE

## 2013-06-12 MED ORDER — IPRATROPIUM BROMIDE 0.02 % IN SOLN
0.5000 mg | Freq: Four times a day (QID) | RESPIRATORY_TRACT | Status: DC
  Administered 2013-06-12 – 2013-06-14 (×5): 0.5 mg via RESPIRATORY_TRACT
  Filled 2013-06-12 (×7): qty 2.5

## 2013-06-12 MED ORDER — LEVALBUTEROL HCL 0.63 MG/3ML IN NEBU
0.6300 mg | INHALATION_SOLUTION | Freq: Four times a day (QID) | RESPIRATORY_TRACT | Status: DC
  Administered 2013-06-12 – 2013-06-14 (×5): 0.63 mg via RESPIRATORY_TRACT
  Filled 2013-06-12 (×15): qty 3

## 2013-06-12 MED ORDER — PANTOPRAZOLE SODIUM 40 MG IV SOLR
40.0000 mg | Freq: Two times a day (BID) | INTRAVENOUS | Status: DC
  Administered 2013-06-12 – 2013-06-14 (×5): 40 mg via INTRAVENOUS
  Filled 2013-06-12 (×7): qty 40

## 2013-06-12 MED ORDER — STARCH (THICKENING) PO POWD
ORAL | Status: DC | PRN
  Filled 2013-06-12: qty 227

## 2013-06-12 MED ORDER — PANTOPRAZOLE SODIUM 40 MG PO TBEC: 40.0000 mg | DELAYED_RELEASE_TABLET | Freq: Two times a day (BID) | ORAL | Status: DC

## 2013-06-12 NOTE — Progress Notes (Signed)
Transferred in from 2 west by bed, awake and alert. Not in any form of distress.

## 2013-06-12 NOTE — Progress Notes (Signed)
Placed pt on AFM per MD order for humidity. Will cont to monitor

## 2013-06-12 NOTE — Progress Notes (Signed)
NUTRITION FOLLOW UP  Intervention:   1.  Modify diet; resume PO diet once medically appropriate per MD discretion. 2.  Supplements; continue supplements thickened to appropriate consistency to support nutrition.   Nutrition Dx:   Inadequate oral intake, ongoing.   Monitor:   1.  Food/Beverage; pt meeting >/=90% estimated needs with tolerance.  Improvement noted. 2.  Wt/wt change; monitor trends.  Ongoing  Assessment:   Patient with PMH of cholelithiasis & ETOH abuse (3-4 40 oz daily) who presented to [facility] ED with complaints of "sore", intermittent, worsening, non-radiating RUQ abdominal pain. 12/18 [place] of abdomen showed liver masses with metastases.   EGD did not show esophageal lesions.  Pt s/p acute hemorrhage (12/20) which has been resolved.  Pt with ongoing discussions r/t to prognosis and plan of care.  Next step is biopsy, if able. PMT involved.   RD notes pt was evaluated by ENT who determined significant inflammation around posterior glottis and subglottis.  SLP consult recommended and pt is currently NPO until assessment complete.   Pt's PO intake has improved to 75-100% of meals when not NPO.  He did complain of poor PO related to frequent meals which may have improved now that he has been intermittently NPO.  Will see if diet modification will also assist with fatigue during meals.  Continue supplements diet order allows.  RD did modify order in the event thickened liquids are required.   Height: Ht Readings from Last 1 Encounters:  05/30/13 5\' 6"  (1.676 m)    Weight Status:   Wt Readings from Last 1 Encounters:  06/12/13 135 lb 6.4 oz (61.417 kg)    Re-estimated needs:  Kcal: 1930-2070 Protein: 82-96g Fluid: ~2.0 L/day  Skin: Generalized edema  Diet Order: NPO   Intake/Output Summary (Last 24 hours) at 06/12/13 1117 Last data filed at 06/12/13 0900  Gross per 24 hour  Intake    480 ml  Output    100 ml  Net    380 ml    Last BM: 12/30, some  blood   Labs:   Recent Labs Lab 06/10/13 0355 06/11/13 0740 06/11/13 1700 06/12/13 0520  NA 145 152* 150* 151*  K 3.4* 4.3 3.8 3.4*  CL 113* 113* 111 114*  CO2 24 26 25 24   BUN 26* 26* 26* 25*  CREATININE 0.89 1.13 1.10 1.00  CALCIUM 8.4 9.1 8.6 8.6  MG 2.2 2.2  --  2.2  GLUCOSE 105* 88 104* 109*    CBG (last 3)   Recent Labs  06/10/13 1725 06/10/13 2106 06/11/13 0803  GLUCAP 143* 112* 77    Scheduled Meds: . albumin human  25 g Intravenous BID  . cefTRIAXone (ROCEPHIN)  IV  2 g Intravenous Q24H  . dextromethorphan-guaiFENesin  1 tablet Oral BID  . feeding supplement (ENSURE COMPLETE)  237 mL Oral BID BM  . folic acid  1 mg Oral Daily  . furosemide  40 mg Intravenous Daily  . ipratropium  0.5 mg Nebulization Q6H  . levalbuterol  0.63 mg Nebulization Q6H  . multivitamin with minerals  1 tablet Oral Daily  . pantoprazole (PROTONIX) IV  40 mg Intravenous Q12H  . potassium chloride SA  40 mEq Oral BID  . sodium chloride  3 mL Intravenous Q12H  . spironolactone  100 mg Oral Daily  . thiamine  100 mg Oral Daily    Continuous Infusions:   Loyce Dys, MS RD LDN Clinical Inpatient Dietitian Pager: [phone] Weekend/After hours pager: [phone]

## 2013-06-12 NOTE — Progress Notes (Signed)
Subjective:   Patient seen and examined at the bedside this morning. He is sitting in his recliner. He still is having some shortness of breath, worse at night. He has still been coughing, producing clear sputum. He is still hoarse. His abdominal pain is better than it was yesterday. He denies fevers and chills. Overnight patient noted blood in his BM. Nurse reviewed it and documented a small amount of noticeable bright red blood mix with soft but formed brown stool.   Objective:   Vital signs in last 24 hours: Filed Vitals:   06/11/13 1450 06/11/13 2029 06/12/13 0425 06/12/13 1104  BP: 135/78 138/78 151/77   Pulse: 115 88 96 115  Temp: 97.9 F (36.6 C) 99.5 F (37.5 C) 99.6 F (37.6 C)   TempSrc: Oral Oral Oral   Resp: 18 18 18    Height:      Weight:   135 lb 6.4 oz (61.417 kg)   SpO2: 100% 97% 98% 96%   Weight change: -12 lb 1.4 oz (-5.483 kg)  Intake/Output Summary (Last 24 hours) at 06/12/13 1112 Last data filed at 06/12/13 0900  Gross per 24 hour  Intake    480 ml  Output    100 ml  Net    380 ml    Physical Exam: Constitutional: Vital signs reviewed.  Patient is sitting in a recliner in no acute distress and cooperative with exam.   Head: Normocephalic and atraumatic Eyes: PERRL, EOMI, +scleral icterus  Neck: Supple, Trachea midline. Still with hoarse voice. Cardiovascular: Regular rate and rhythm, S1, S2 present, no MRG, DP 2+ b/l; improvement in pitting edema up to the thighs b/l Pulmonary/Chest: normal respiratory effort, inspiratory wheezes noted in upper airway, soft crackles at right lung base still present, SpO2 96% on room air; no gynecomastia Abdominal: Soft. +BS, no tenderness to palpation, nondistended Musculoskeletal: No joint deformities, erythema, or stiffness Genitourinary: Scrotal edema appears to be improving Neurological: A&O x3, cranial nerve II-XII are grossly intact, moving all extremities Skin: Warm, dry and intact. No rash Psychiatric:  Normal mood and affect.   Lab Results:  BMP:  Recent Labs Lab 06/11/13 0740 06/11/13 1700 06/12/13 0520  NA 152* 150* 151*  K 4.3 3.8 3.4*  CL 113* 111 114*  CO2 26 25 24   GLUCOSE 88 104* 109*  BUN 26* 26* 25*  CREATININE 1.13 1.10 1.00  CALCIUM 9.1 8.6 8.6  MG 2.2  --  2.2    CBC:  Recent Labs Lab 06/11/13 0740 06/12/13 0520  WBC 17.1* 19.1*  HGB 12.1* 11.5*  HCT 38.4* 35.2*  MCV 91.9 91.9  PLT 286 293    Coagulation:  Recent Labs Lab 06/09/13 0605  LABPROT 14.0  INR 1.10    CBG:            Recent Labs Lab 06/09/13 2156 06/10/13 0741 06/10/13 1138 06/10/13 1725 06/10/13 2106 06/11/13 0803  GLUCAP 112* 92 120* 143* 112* 77          LFTs:  Recent Labs Lab 06/07/13 0545 06/08/13 0450  AST 185* 187*  ALT 82* 87*  ALKPHOS 436* 458*  BILITOT 2.8* 3.1*  PROT 6.6 6.9  ALBUMIN 1.7* 1.8*    Cardiac Enzymes: No results found for this basename: CKTOTAL, CKMB, CKMBINDEX, TROPONINI,  in the last 168 hours Lab Results  Component Value Date   CKTOTAL 232 05/31/2013   CKMB 3.9 05/31/2013   TROPONINI <0.30 06/01/2013    EKG:  Date/Time:  Thursday  May 30 2013 09:46:47 EST Ventricular Rate:  82 PR Interval:  138 QRS Duration: 72 QT Interval:  388 QTC Calculation: 453 R Axis:   68 Text Interpretation:  Sinus rhythm Normal ECG No significant change since last tracing Confirmed by Bebe Shaggy  MD, DONALD [phone]) on 05/30/2013 10:02:53 AM   Micro Results: Recent Results (from the past 240 hour(s))  URINE CULTURE     Status: None   Collection Time    06/09/13  4:43 PM      Result Value Range Status   Specimen Description URINE, RANDOM   Final   Special Requests NONE   Final   Culture  Setup Time     Final   Value: 06/09/2013 22:56     Performed at [facility] Count     Final   Value: >=100,000 COLONIES/ML     Performed at [facility]   Culture     Final   Value: ESCHERICHIA COLI     Performed at [facility]   Report Status 06/11/2013 FINAL   Final   Organism ID, Bacteria ESCHERICHIA COLI   Final  CULTURE, BLOOD (ROUTINE X 2)     Status: None   Collection Time    06/09/13  9:45 PM      Result Value Range Status   Specimen Description BLOOD RIGHT ARM   Final   Special Requests BOTTLES DRAWN AEROBIC ONLY 10CC   Final   Culture  Setup Time     Final   Value: 06/10/2013 11:16     Performed at [facility]   Culture     Final   Value:        BLOOD CULTURE RECEIVED NO GROWTH TO DATE CULTURE WILL BE HELD FOR 5 DAYS BEFORE ISSUING A FINAL NEGATIVE REPORT     Performed at [facility]   Report Status PENDING   Incomplete  CULTURE, BLOOD (ROUTINE X 2)     Status: None   Collection Time    06/09/13  9:55 PM      Result Value Range Status   Specimen Description BLOOD LEFT HAND   Final   Special Requests BOTTLES DRAWN AEROBIC ONLY 10CC   Final   Culture  Setup Time     Final   Value: 06/10/2013 11:16     Performed at [facility]   Culture     Final   Value:        BLOOD CULTURE RECEIVED NO GROWTH TO DATE CULTURE WILL BE HELD FOR 5 DAYS BEFORE ISSUING A FINAL NEGATIVE REPORT     Performed at [facility]   Report Status PENDING   Incomplete  BODY FLUID CULTURE     Status: None   Collection Time    06/11/13  2:16 PM      Result Value Range Status   Specimen Description ASCITIC ABDOMEN FLUID   Final   Special Requests FLUID   Final   Gram Stain     Final   Value: RARE WBC PRESENT,BOTH PMN AND MONONUCLEAR     NO ORGANISMS SEEN     Performed at [facility]   Culture PENDING   Incomplete   Report Status PENDING   Incomplete    Medications:  Scheduled Meds: . albumin human  25 g Intravenous BID  . cefTRIAXone (ROCEPHIN)  IV  2 g Intravenous Q24H  . dextromethorphan-guaiFENesin  1 tablet Oral BID  .  feeding supplement (ENSURE COMPLETE)  237 mL Oral BID BM  . folic acid  1 mg Oral Daily  . furosemide  40 mg Intravenous Daily  .  ipratropium  0.5 mg Nebulization Q6H  . levalbuterol  0.63 mg Nebulization Q6H  . multivitamin with minerals  1 tablet Oral Daily  . pantoprazole (PROTONIX) IV  40 mg Intravenous Q12H  . potassium chloride SA  40 mEq Oral BID  . sodium chloride  3 mL Intravenous Q12H  . spironolactone  100 mg Oral Daily  . thiamine  100 mg Oral Daily   Continuous Infusions:  PRN Meds:.sodium chloride, diphenhydrAMINE, sodium chloride  Antibiotics: Anti-infectives   Start     Dose/Rate Route Frequency Ordered Stop   06/09/13 2000  cefTRIAXone (ROCEPHIN) 2 g in dextrose 5 % 50 mL IVPB     2 g 100 mL/hr over 30 Minutes Intravenous Every 24 hours 06/09/13 1856       Antibiotics Given (last 72 hours)   Date/Time Action Medication Dose Rate   06/09/13 2202 Given  [bld cultures just drawn]   cefTRIAXone (ROCEPHIN) 2 g in dextrose 5 % 50 mL IVPB 2 g 100 mL/hr   06/10/13 1955 Given   cefTRIAXone (ROCEPHIN) 2 g in dextrose 5 % 50 mL IVPB 2 g 100 mL/hr   06/11/13 2050 Given   cefTRIAXone (ROCEPHIN) 2 g in dextrose 5 % 50 mL IVPB 2 g 100 mL/hr       Day of Hospitalization:  13 Consults: Treatment Team:  Palliative Steva Ready, MD  Assessment/Plan:   #Multiple hepatic masses - Etiology unknown. MELD score: 15. Pt with a h/o hepatitis C and his AFP was significantly elevated at 670.9. Ca 19-9 = 96.2 (also high). 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 However, CT appearance looks atypical for this. Esophageal primary was initially suspected, but EGD was negative. Our big issue right now is making a tissue diagnosis to guide prognosis, given his recent near-fatal perihepatic bleed. GI did not feel comfortable pursuing an EUS biopsy of the liver. We were planning to get IR involved for possible percutaneous liver biopsy, however after discussion with family about the risks and benefits of the procedure, patient decided he did not want to pursue a liver biopsy. Medical oncology recommended pursuing a diagnostic  paracentesis to get a tissue diagnosis and avoid sticking the liver. This was performed yesterday. It showed elevated LDH (2509) suggestive of malignant effusion. Ascites albumin to calculate SAAG is still pending. Cytology pending. Cell count shows 5560 WBC, 46% neutrophils, concerning for SBP (see below). Gram stain > RARE WBC PRESENT, BOTH PMN AND MONONUCLEAR, NO ORGANISMS SEEN. Palliative care has been following (Dr.Taylor) since his time in the ICU and will re-evaluate soon.  - Appreciate medical oncology recs - Follow up fluid cytology - Follow up fluid culture - NPO pending SLP eval and treat (see below) - Continue PT/OT  #SBP - Paracentesis fluid cell count shows 5560 WBC, 46% neutrophils, concerning for SBP. Patient is already on ceftriaxone 2g IV daily for pyelonephritis. Abdominal pain is improving today and all vital signs are stable. He is afebrile. - Continue Ceftriaxone 2g IV q24hr - Added albumin 25% 25g x3 for a total of 75g given by the end of today (1.5g/kg) - Tomorrow will give 25g x2 for a total of 50g (1g/kg)  #Hoarseness 2/2 posterior glottic and subgottic inflammation - Patient has suffered from hoarseness and cough since being extubated on 12/23. ENT evaluated him today. Fiberoptic exam  was performed at the bedside. Findings include marked inflammatory, granulation changes of the posterior glottis and subgottis due to intubation. This is keeping the vocal folds from vibrating properly, impeding normal vocal fold mobility, and partially blocking the airway. Dr. Jenne Pane feels this should improve with time. However, if breathing were to become difficult, steroids and epinephrine nebs could be tried but tracheostomy would possibly be required. - Appreciate ENT recs - NPO pending SLP eval and treat - Protonix 40mg  IV BID - Start humidified face mask oxygen, spoke with RT personally about this - Informed his nurse to page me if patient's respiratory status declines, low threshold  for transfer to step down if this occurs - Dr. Jenne Pane will re-scope him next week as inpatient or outpatient depending on discharge plan  #Weight gain - Likely secondary to fluid overload and malignant ascites. He has scrotal edema and mild abdominal ascites. Weight down trending with diuretics (see below) and improved volume status clinically. He is s/p paracentesis on 12/30. - Daily weights  - Strict ins and outs (of note, nurse has had trouble with this 2/2 his urinary urgency) - Lasix 40mg  IV daily - Spironolactone 100mg  po daily - Continue scrotal support  Filed Weights   06/10/13 1447 06/11/13 0459 06/12/13 0425  Weight: 147 lb 7.8 oz (66.9 kg) 151 lb 0.2 oz (68.5 kg) 135 lb 6.4 oz (61.417 kg)    #Complicated UTI vs. pyelonephritis - Patient noted to have flank pain, mild dysuria, and gross hematuria on 12/18. UA was positive for nitrites and LE, many bacteria, few squams. Presentation suggestive of UTI vs. Pyelonephritis, likely associated with having a Foley catheter while he was in the ICU. Urine culture growing >100K E. Coli, pan-sensitive. - Follow up blood culture > NGTD - Ceftriaxone 2g IV q24hr  #Leukocytosis - Trend below. Patient has a UTI/pyelonephritis, SBP, and has been on systemic steroids, which we are now tapering. He is afebrile, AVSS. - Continue antibiotics as above - Continue to monitor  WBC  Date Value Range Status  06/12/2013 19.1* 4.0 - 10.5 K/uL Final  06/11/2013 17.1* 4.0 - 10.5 K/uL Final  06/10/2013 20.0* 4.0 - 10.5 K/uL Final  06/09/2013 21.6* 4.0 - 10.5 K/uL Final  06/08/2013 20.8* 4.0 - 10.5 K/uL Final    #Shock due to perihepatic hemorrhage, resolved - Patient is s/p VT arrest/Code Blue on 12/20 secondary to hemorrhagic shock from perihepatic hemorrhage from subcapsular lesion. IR performed emergent hepatic/mesentericGel-Foam and coil embolization. Vital signs are currently stable. Hgb stable as below. On 12/27 PT was 14.0, INR 1.10. - Daily  CBC  Hemoglobin  Date Value Range Status  06/12/2013 11.5* 13.0 - 17.0 g/dL Final  25/36/6440 34.7* 13.0 - 17.0 g/dL Final  42/59/5638 75.6* 13.0 - 17.0 g/dL Final  43/32/9518 84.1* 13.0 - 17.0 g/dL Final  66/11/3014 01.0* 13.0 - 17.0 g/dL Final   #Rectal bleeding - Nurse noted small amount of noticeable bright red blood mix with soft but formed brown stool. Likely 2/2 hemorrhoidal bleed/portal hypertension due to appearance, in which case banding is contraindicated. Hemoglobin stable as above. - Continue to monitor and trend hemoglobin  #Hypokalemia-  Improving as below. - Continue K-dur BID - BMP q12h since we have added spironolactone to his regimen and may have to adjust K-dur supplementation  Potassium  Date Value Range Status  06/12/2013 3.4* 3.7 - 5.3 mEq/L Final     Please note change in reference range.  06/11/2013 3.8  3.7 - 5.3 mEq/L Final  Please note change in reference range.  06/11/2013 4.3  3.7 - 5.3 mEq/L Final     Please note change in reference range.  06/10/2013 3.4* 3.5 - 5.1 mEq/L Final  06/09/2013 3.3* 3.5 - 5.1 mEq/L Final    #Adrenal insufficiency? - Concern for adrenal insufficiency when he was in the unit. 06/01/13 Cortisol = 16.1, which is wnl. Tapering steroids. - Discontinue Cortef 20mg  qd  #Severe malnutrition in the context of chronic illness - Nutrition consulted and recommended ensure complete po bid which was ordered. Prealbumin 7.7 (low). - Appreciate RD recommendations  #Financial issues - Patient is uninsured and does not have a PCP. He rents a room from a friend. - Financial counselor following to see if patient will qualify for Medicaid  - CM following, have given patient MATCH letter for mediations once we are ready for discharge - Patient to follow up in [facility]  #Code status - FULL CODE. Re-confirmed this today with the patient and his family.   #Dispo- Disposition is deferred at this time.  Anticipated discharge in  approximately 1-2 days.    LOS: 13 days   Vivi Barrack, MD PGY-1, Internal Medicine  [phone] 06/12/2013, 11:12 AM
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
## 2013-06-12 NOTE — Progress Notes (Signed)
Occupational Therapy Treatment Patient Details Name: Micheal Daniel MRN: [id number] DOB: 1955/12/03 Today's Date: 06/12/2013 Time: 1040-1100 OT Time Calculation (min): 20 min  OT Assessment / Plan / Recommendation  History of present illness [name] is a 57 yo man with h/o cholelithiasis who presented to [facility] ED with complaints of "sore", intermittent, worsening, non-radiating RUQ abdominal pain x 24h.  Patient with liver mets - unsure of primary.     OT comments  Pt is making excellent progress with OT at this time, currently at a supervision level for most selfcare tasks and toileting.  He is close to modified independent level with a few more days of activity.  Will continue to follow.  O2 sats 96% on room air throughout during session.  HR increasing to 115 as well.    Follow Up Recommendations  No OT follow up       Equipment Recommendations  None recommended by OT       Frequency Min 2X/week   Progress towards OT Goals Progress towards OT goals: Progressing toward goals  Plan Discharge plan remains appropriate    Precautions / Restrictions Precautions Precautions: None Restrictions Weight Bearing Restrictions: No   Pertinent Vitals/Pain No report of pain during session, O2 sats 96% on room air, HR increasing to 115 with activity    ADL  Grooming: Performed;Shaving;Wash/dry hands;Wash/dry face;Teeth care;Supervision/safety Where Assessed - Grooming: Unsupported standing Upper Body Bathing: Performed;Supervision/safety Where Assessed - Upper Body Bathing: Unsupported standing Lower Body Bathing: Performed;Supervision/safety Where Assessed - Lower Body Bathing: Supported sit to stand Toilet Transfer: Dentist: Regular height toilet Toileting - Architect and Hygiene: Performed;Supervision/safety Where Assessed - Engineer, mining and Hygiene: Sit to stand from 3-in-1 or  toilet Transfers/Ambulation Related to ADLs: Pt overall supervision level for mobility without use of assistive device in the room during selfcare tasks. ADL Comments: Pt able to stand at the sink for grooming and bathing activites.  He was able to use the sink for stability during standing when attempting to reach down and wash his lower legs.  Dyspnea at 2/4 after standing with O2 sats at 96% on room air.       OT Goals(current goals can now be found in the care plan section) Acute Rehab OT Goals Patient Stated Goal: Pt agreed to performing grooming tasks and bathing during session.  Visit Information  Last OT Received On: 06/12/13 Assistance Needed: +1 History of Present Illness: [name] is a 57 yo man with h/o cholelithiasis who presented to [facility] ED with complaints of "sore", intermittent, worsening, non-radiating RUQ abdominal pain x 24h.  Patient with liver mets - unsure of primary.            Cognition  Cognition Arousal/Alertness: Awake/alert Behavior During Therapy: WFL for tasks assessed/performed Overall Cognitive Status: Within Functional Limits for tasks assessed    Mobility  Bed Mobility Bed Mobility: Not assessed Transfers Transfers: Sit to Stand Sit to Stand: 6: Modified independent (Device/Increase time);From toilet Stand to Sit: 6: Modified independent (Device/Increase time);To chair/3-in-1       Balance Balance Balance Assessed: Yes Dynamic Standing Balance Dynamic Standing - Balance Support: No upper extremity supported Dynamic Standing - Level of Assistance: 5: Stand by assistance   End of Session OT - End of Session Activity Tolerance: Patient tolerated treatment well Patient left: in chair;with call bell/phone within reach     [facility] OTR/L 06/12/2013, 11:09 AM
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
## 2013-06-12 NOTE — Procedures (Signed)
Preop diagnosis: Dysphonia, dysphagia Postop diagnosis: Same, marked glottic and subglottic inflammatory changes related to intubation Procedure: Transnasal fiberoptic laryngoscopy Surgeon: Jenne Pane Anesth: None Compl: None Findings:  There is marked exudative inflammation of the posterior glottis and subglottis with granulation tissue extending to the midline in the mid subglottis as a thin bridge.  Inflammatory tissue is in a pattern formed around the endotracheal tube sitting in the posterior glottis.  Vocal folds move but sluggishly and the glottis does not close during phonation.  No other abnormalities seen. Description: After discussing risks, benefits, and alternatives, the fiberoptic laryngoscope was gently passed through the right nasal passage to view the pharynx and larnynx.  Findings are noted above.  After completion, the scope was removed and he was returned to nursing care in stable condition.

## 2013-06-12 NOTE — Progress Notes (Signed)
Pt told RN that he had 2 bowl movements and noticed blood.  RN asked to see next bowl movement.  Third bowl movement was soft but formed but there was a small amount noticeable bright red blood mix with stool.  Call light in reach, shared with next shift.    Thane Edu,

## 2013-06-13 DIAGNOSIS — Z7189 Other specified counseling: Secondary | ICD-10-CM

## 2013-06-13 LAB — GLUCOSE, CAPILLARY
Glucose-Capillary: 77 mg/dL (ref 70–99)
Glucose-Capillary: 100 mg/dL — ABNORMAL HIGH (ref 70–99)
Glucose-Capillary: 98 mg/dL (ref 70–99)
Glucose-Capillary: 101 mg/dL — ABNORMAL HIGH (ref 70–99)

## 2013-06-13 LAB — BASIC METABOLIC PANEL
BUN: 21 mg/dL (ref 6–23)
BUN: 19 mg/dL (ref 6–23)
CO2: 20 mEq/L (ref 19–32)
CO2: 27 mEq/L (ref 19–32)
Calcium: 9 mg/dL (ref 8.4–10.5)
Calcium: 9.3 mg/dL (ref 8.4–10.5)
Chloride: 112 mEq/L (ref 96–112)
Chloride: 111 mEq/L (ref 96–112)
Creatinine, Ser: 0.89 mg/dL (ref 0.50–1.35)
Creatinine, Ser: 0.97 mg/dL (ref 0.50–1.35)
GFR calc Af Amer: >90 mL/min (ref >90)
GFR calc Af Amer: >90 mL/min (ref >90)
GFR calc non Af Amer: >90 mL/min (ref >90)
GFR calc non Af Amer: 89 mL/min — ABNORMAL LOW (ref >90)
Glucose, Bld: 82 mg/dL (ref 70–99)
Glucose, Bld: 109 mg/dL — ABNORMAL HIGH (ref 70–99)
Potassium: 3.7 mEq/L (ref 3.7–5.3)
Potassium: 4.3 mEq/L (ref 3.7–5.3)
Sodium: 147 mEq/L (ref 137–147)
Sodium: 149 mEq/L — ABNORMAL HIGH (ref 137–147)

## 2013-06-13 LAB — CBC
HCT: 32.5 % — ABNORMAL LOW (ref 39.0–52.0)
Hemoglobin: 10.8 g/dL — ABNORMAL LOW (ref 13.0–17.0)
MCH: 30.3 pg (ref 26.0–34.0)
MCHC: 33.2 g/dL (ref 30.0–36.0)
MCV: 91.3 fL (ref 78.0–100.0)
Platelets: 259 10*3/uL (ref 150–400)
RBC: 3.56 MIL/uL — ABNORMAL LOW (ref 4.22–5.81)
RDW: 19 % — ABNORMAL HIGH (ref 11.5–15.5)
WBC: 17.7 10*3/uL — ABNORMAL HIGH (ref 4.0–10.5)

## 2013-06-13 LAB — MAGNESIUM: Magnesium: 2.2 mg/dL (ref 1.5–2.5)

## 2013-06-13 MED ORDER — POTASSIUM CHLORIDE 20 MEQ/15ML (10%) PO LIQD
ORAL | Status: AC
  Filled 2013-06-13: qty 30

## 2013-06-13 NOTE — Progress Notes (Signed)
Subjective:   Patient seen and examined at the bedside this morning. He lying in bed with his humidified oxygen mask in place. He appears comfortable. He says his shortness of breath is better today. He denies chest pain, abdominal pain. I told him the cytology from his paracentesis did not give [place] the answer we wanted, unfortunately. Patient is open to another goals of care conversation with Palliative Care.  Objective:   Vital signs in last 24 hours: Filed Vitals:   06/13/13 0800 06/13/13 0851 06/13/13 1043 06/13/13 1100  BP: 155/87  155/87   Pulse:  94 103   Temp:  98.7 F (37.1 C)    TempSrc:  Oral    Resp:   24   Height:      Weight:    130 lb 1.1 oz (59 kg)  SpO2:  98% 97%    Weight change:   Intake/Output Summary (Last 24 hours) at 06/13/13 1219 Last data filed at 06/13/13 1100  Gross per 24 hour  Intake    350 ml  Output    675 ml  Net   -325 ml    Physical Exam: Constitutional: No distress.  Head: Normocephalic and atraumatic Eyes: PERRL, EOMI, +scleral icterus  Neck: Supple, Trachea midline. Still with hoarse voice. Cardiovascular: Regular rate and rhythm, S1, S2 present, no MRG, DP 2+ b/l; improvement in pitting edema up to the thighs b/l Pulmonary/Chest: normal respiratory effort, inspiratory wheezes still noted in upper airway, soft crackles at right lung base still present, SpO2 97% on aerosolized mask FiO2; no gynecomastia Abdominal: Soft. +BS, no tenderness to palpation, mildly distended Musculoskeletal: No joint deformities, erythema, or stiffness Genitourinary: Scrotal edema appears to be improving Neurological: A&O x3, cranial nerve II-XII are grossly intact, moving all extremities Skin: Warm, dry and intact. No rash Psychiatric: Normal mood and affect.   Lab Results:  BMP:  Recent Labs Lab 06/12/13 0520 06/12/13 1705 06/13/13 0410  NA 151* 147 147  K 3.4* 3.7 3.7  CL 114* 109 112  CO2 24 26 20   GLUCOSE 109* 91 82  BUN 25* 22 21    CREATININE 1.00 1.00 0.89  CALCIUM 8.6 8.7 9.0  MG 2.2  --  2.2    CBC:  Recent Labs Lab 06/12/13 0520 06/13/13 0410  WBC 19.1* 17.7*  HGB 11.5* 10.8*  HCT 35.2* 32.5*  MCV 91.9 91.3  PLT 293 259    Coagulation:  Recent Labs Lab 06/09/13 0605  LABPROT 14.0  INR 1.10    CBG:            Recent Labs Lab 06/10/13 1138 06/10/13 1725 06/10/13 2106 06/11/13 0803 06/12/13 2136 06/13/13 0854  GLUCAP 120* 143* 112* 77 94 77          LFTs:  Recent Labs Lab 06/08/13 0450 06/12/13 1100  AST 187* 218*  ALT 87* 78*  ALKPHOS 458* 373*  BILITOT 3.1* 5.2*  PROT 6.9 7.0  ALBUMIN 1.8* 2.1*    Cardiac Enzymes: No results found for this basename: CKTOTAL, CKMB, CKMBINDEX, TROPONINI,  in the last 168 hours Lab Results  Component Value Date   CKTOTAL 232 05/31/2013   CKMB 3.9 05/31/2013   TROPONINI <0.30 06/01/2013    EKG:  Date/Time:  Thursday May 30 2013 09:46:47 EST Ventricular Rate:  82 PR Interval:  138 QRS Duration: 72 QT Interval:  388 QTC Calculation: 453 R Axis:   68 Text Interpretation:  Sinus rhythm Normal ECG No significant change  since last tracing Confirmed by [facility]  MD, Dudley [phone]) on 05/30/2013 10:02:53 AM   Micro Results: Recent Results (from the past 240 hour(s))  URINE CULTURE     Status: None   Collection Time    06/09/13  4:43 PM      Result Value Range Status   Specimen Description URINE, RANDOM   Final   Special Requests NONE   Final   Culture  Setup Time     Final   Value: 06/09/2013 22:56     Performed at [facility]     Final   Value: >=100,000 COLONIES/ML     Performed at [facility]   Culture     Final   Value: ESCHERICHIA COLI     Performed at [facility]   Report Status 06/11/2013 FINAL   Final   Organism ID, Bacteria ESCHERICHIA COLI   Final  CULTURE, BLOOD (ROUTINE X 2)     Status: None   Collection Time    06/09/13  9:45 PM      Result Value Range Status    Specimen Description BLOOD RIGHT ARM   Final   Special Requests BOTTLES DRAWN AEROBIC ONLY 10CC   Final   Culture  Setup Time     Final   Value: 06/10/2013 11:16     Performed at [facility]   Culture     Final   Value:        BLOOD CULTURE RECEIVED NO GROWTH TO DATE CULTURE WILL BE HELD FOR 5 DAYS BEFORE ISSUING A FINAL NEGATIVE REPORT     Performed at [facility]   Report Status PENDING   Incomplete  CULTURE, BLOOD (ROUTINE X 2)     Status: None   Collection Time    06/09/13  9:55 PM      Result Value Range Status   Specimen Description BLOOD LEFT HAND   Final   Special Requests BOTTLES DRAWN AEROBIC ONLY 10CC   Final   Culture  Setup Time     Final   Value: 06/10/2013 11:16     Performed at [facility]   Culture     Final   Value:        BLOOD CULTURE RECEIVED NO GROWTH TO DATE CULTURE WILL BE HELD FOR 5 DAYS BEFORE ISSUING A FINAL NEGATIVE REPORT     Performed at [facility]   Report Status PENDING   Incomplete  BODY FLUID CULTURE     Status: None   Collection Time    06/11/13  2:16 PM      Result Value Range Status   Specimen Description ASCITIC ABDOMEN FLUID   Final   Special Requests 60ML FLUID   Final   Gram Stain     Final   Value: RARE WBC PRESENT,BOTH PMN AND MONONUCLEAR     NO ORGANISMS SEEN     Performed at [facility]     Final   Value: NO GROWTH 1 DAY     Performed at [facility]   Report Status PENDING   Incomplete  MRSA PCR SCREENING     Status: None   Collection Time    06/12/13  7:00 PM      Result Value Range Status   MRSA by PCR NEGATIVE  NEGATIVE Final   Comment:            The GeneXpert MRSA  Assay (FDA     approved for NASAL specimens     only), is one component of a     comprehensive MRSA colonization     surveillance program. It is not     intended to diagnose MRSA     infection nor to guide or     monitor treatment for     MRSA infections.    Medications:  Scheduled Meds: .  albumin human  25 g Intravenous BID  . cefTRIAXone (ROCEPHIN)  IV  2 g Intravenous Q24H  . dextromethorphan-guaiFENesin  1 tablet Oral BID  . feeding supplement (ENSURE COMPLETE)  237 mL Oral BID BM  . folic acid  1 mg Oral Daily  . furosemide  40 mg Intravenous Daily  . ipratropium  0.5 mg Nebulization Q6H  . levalbuterol  0.63 mg Nebulization Q6H  . multivitamin with minerals  1 tablet Oral Daily  . pantoprazole (PROTONIX) IV  40 mg Intravenous Q12H  . potassium chloride SA  40 mEq Oral BID  . sodium chloride  3 mL Intravenous Q12H  . spironolactone  100 mg Oral Daily  . thiamine  100 mg Oral Daily   Continuous Infusions:  PRN Meds:.sodium chloride, diphenhydrAMINE, food thickener, sodium chloride  Antibiotics: Anti-infectives   Start     Dose/Rate Route Frequency Ordered Stop   06/09/13 2000  cefTRIAXone (ROCEPHIN) 2 g in dextrose 5 % 50 mL IVPB     2 g 100 mL/hr over 30 Minutes Intravenous Every 24 hours 06/09/13 1856       Antibiotics Given (last 72 hours)   Date/Time Action Medication Dose Rate   06/10/13 1955 Given   cefTRIAXone (ROCEPHIN) 2 g in dextrose 5 % 50 mL IVPB 2 g 100 mL/hr   06/11/13 2050 Given   cefTRIAXone (ROCEPHIN) 2 g in dextrose 5 % 50 mL IVPB 2 g 100 mL/hr   06/12/13 2100 Given   cefTRIAXone (ROCEPHIN) 2 g in dextrose 5 % 50 mL IVPB 2 g 100 mL/hr       Day of Hospitalization:  14 Consults: Treatment Team:  Palliative Beola Cord, MD  Assessment/Plan:   #Multiple hepatic masses with omental caking, 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 He has a history of hepatitis C and his AFP is significantly elevated at 670.9. Ca 19-9 = 96.2 (also high). Wendell could be the primary; however, CT appearance looks atypical. Esophageal primary ruled out with EGD. Our big issue right now is making a tissue diagnosis to guide prognosis, given his recent near-fatal perihepatic bleed. Patient does not want  to pursue a liver biopsy 2/2 risks. We therefore performed a diagnostic paracentesis on 12/30. 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 Unfortunately, the sample was not apparently sufficient for a tissue diagnosis via cytology. Pathology report shows scattered atypical cells not quantitatively diagnostic of malignancy, and too scant of tissue present for additional studies. I spoke with Dr. Marin Olp who predicts survival <2 months given pre-albumin of 7.7. That being said, a tissue diagnosis would still be needed for a definitive answer about prognosis and treatment options (which will most likely be very limited). A possible next step could be re-consulting IR or GI to biopsy the omentum. Palliative care has been following (Dr.Taylor) since his time in the ICU. - Appreciate medical oncology recs - At this point we need another palliative meeting with family to re-evaluate goals of care,  I have discussed the patient with Dr. Hilma Favors who is on call as well as Dr. Lovena Le - Continue PT/OT  #SBP - Paracentesis fluid cell count shows 5560 WBC, 46% neutrophils, consistent with SBP. Culture with NGTD, but patient was already on ceftriaxone 2g IV daily for pyelonephritis at the time of the tap. Abdominal pain is improving today and all vital signs are stable. He is afebrile. - Continue Ceftriaxone 2g IV q24hr for SBP, day 3/5, if fever or abdominal pain recurs will repeat paracentesis - Continue albumin 25% 25g x2 for a total of 50g (1g/kg), day 2/3 - Will need Bactrim 1DS tablet qd for prophylaxis as outpatient  #Hoarseness 2/2 posterior glottic and subgottic inflammation - Patient has suffered from hoarseness and cough since being extubated on 12/23. ENT evaluation showed marked inflammatory, granulation changes of the posterior glottis and subgottis due to intubation, impeding normal vocal fold mobility, and partially blocking the airway. Dr.  Redmond Baseman feels this should improve with time. If breathing were to become difficult, steroids and epinephrine nebs could be tried but tracheostomy would possibly be required. SLP evaluated the patient and recommend conservative diet of Dys 1 and nectar thick liquids. - Appreciate ENT recs - Patient transferred to step down unit (2H) for closer monitoring - Continuous pulse oximetry - Dysphagia 1 (Puree);Nectar-thick liquid diet - Protonix 40mg  IV BID - Humidified face mask oxygen - Dr. Redmond Baseman will re-scope him next week as inpatient or outpatient depending on discharge plan  #Weight gain - Likely secondary to fluid overload and malignant ascites. He had scrotal edema and mild abdominal ascites. Weight down trending with diuretics (see below) and volume status is improving clinically. He is s/p paracentesis on 12/30. - Daily weights  - Strict ins and outs (of note, nurse has had trouble with this 2/2 his urinary urgency) - Lasix 40mg  IV daily - Spironolactone 100mg  po daily - Continue scrotal support  Filed Weights   06/11/13 0459 06/12/13 0425 06/13/13 1100  Weight: 151 lb 0.2 oz (68.5 kg) 135 lb 6.4 oz (61.417 kg) 130 lb 1.1 oz (59 kg)    #Complicated UTI vs. pyelonephritis - Patient noted to have flank pain, mild dysuria, and gross hematuria on 12/18. UA was positive for nitrites and LE, many bacteria, few squams. Presentation suggestive of UTI vs. Pyelonephritis, likely associated with having a Foley catheter while he was in the ICU. Urine culture growing >100K E. Coli, pan-sensitive. - Follow up blood culture > NGTD - Continue Ceftriaxone 2g IV q24hr  #Hypernatremia - Likely 2/2 Lasix. - Continue to monitor - Will consider reducing lasix dose if Na exceeds 152  Sodium  Date Value Range Status  06/13/2013 147  137 - 147 mEq/L Final     Please note change in reference range.  06/12/2013 147  137 - 147 mEq/L Final     Please note change in reference range.  06/12/2013 151* 137 - 147  mEq/L Final     Please note change in reference range.  06/11/2013 150* 137 - 147 mEq/L Final     Please note change in reference range.  06/11/2013 152* 137 - 147 mEq/L Final     Please note change in reference range.    #Leukocytosis - Trending down, see below. Patient has a UTI/pyelonephritis, SBP, and has been on systemic steroids, which we recently tapered off. He is afebrile, AVSS. - Continue antibiotics as above - Continue to monitor  WBC  Date Value Range Status  06/13/2013 17.7* 4.0 -  10.5 K/uL Final  06/12/2013 19.1* 4.0 - 10.5 K/uL Final  06/11/2013 17.1* 4.0 - 10.5 K/uL Final  06/10/2013 20.0* 4.0 - 10.5 K/uL Final  06/09/2013 21.6* 4.0 - 10.5 K/uL Final    #Shock due to perihepatic hemorrhage, resolved - Patient is s/p VT arrest/Code Blue on 12/20 secondary to hemorrhagic shock from perihepatic hemorrhage from subcapsular lesion. IR performed emergent hepatic/mesentericGel-Foam and coil embolization. Vital signs are currently stable. Hgb stable but with slight recent down trend as below, perhaps due to hemorrhoidal bleeding. On 12/27 PT was 14.0, INR 1.10. - Daily CBC  Hemoglobin  Date Value Range Status  06/13/2013 10.8* 13.0 - 17.0 g/dL Final  06/12/2013 11.5* 13.0 - 17.0 g/dL Final  06/11/2013 12.1* 13.0 - 17.0 g/dL Final  06/10/2013 12.0* 13.0 - 17.0 g/dL Final  06/09/2013 12.2* 13.0 - 17.0 g/dL Final   #Rectal bleeding - Nurse noted small amount of noticeable bright red blood mix with soft but formed brown stool. Likely 2/2 hemorrhoidal bleed/portal hypertension due to appearance, in which case banding is contraindicated. Hemoglobin stable as above. - Continue to monitor and trend hemoglobin  #Hypokalemia-  Improving as below. - Continue K-dur 45meq BID - BMP q12h since we have added spironolactone to his regimen and may have to adjust K-dur supplementation  Potassium  Date Value Range Status  06/13/2013 3.7  3.7 - 5.3 mEq/L Final     Please note change in  reference range.  06/12/2013 3.7  3.7 - 5.3 mEq/L Final     Please note change in reference range.  06/12/2013 3.4* 3.7 - 5.3 mEq/L Final     Please note change in reference range.  06/11/2013 3.8  3.7 - 5.3 mEq/L Final     Please note change in reference range.  06/11/2013 4.3  3.7 - 5.3 mEq/L Final     Please note change in reference range.    #Adrenal insufficiency? - There was concern for adrenal insufficiency when he was in the unit. 06/01/13 Cortisol = 16.1, which is wnl. We have tapered off his steroids since that time.  #Severe malnutrition in the context of chronic illness - Nutrition consulted and recommended ensure complete po bid which was ordered. Prealbumin 7.7 (low). - Appreciate RD recommendations  #Financial issues - Patient is uninsured and does not have a PCP. He rents a room from a friend. - Financial counselor following to see if patient will qualify for Medicaid  - CM following, have given patient [place] letter for mediations once we are ready for discharge - Patient to follow up in [facility]  #Code status - FULL CODE. Re-confirmed this today with the patient and his family.   #Dispo- Disposition is deferred at this time.  Anticipated discharge in approximately 3-5 days.    LOS: 14 days   Lesly Dukes, MD PGY-1, Internal Medicine  [phone] 06/13/2013, 12:19 PM

## 2013-06-14 DIAGNOSIS — Z7189 Other specified counseling: Secondary | ICD-10-CM

## 2013-06-14 DIAGNOSIS — Z515 Encounter for palliative care: Secondary | ICD-10-CM

## 2013-06-14 LAB — BASIC METABOLIC PANEL
BUN: 18 mg/dL (ref 6–23)
CO2: 20 meq/L (ref 19–32)
Calcium: 9.1 mg/dL (ref 8.4–10.5)
Chloride: 111 mEq/L (ref 96–112)
Creatinine, Ser: 0.83 mg/dL (ref 0.50–1.35)
GFR calc non Af Amer: >90 mL/min (ref >90)
Glucose, Bld: 83 mg/dL (ref 70–99)
Potassium: 4 mEq/L (ref 3.7–5.3)
Sodium: 148 mEq/L — ABNORMAL HIGH (ref 137–147)

## 2013-06-14 LAB — CBC
HCT: 31.1 % — ABNORMAL LOW (ref 39.0–52.0)
HEMOGLOBIN: 10.5 g/dL — AB (ref 13.0–17.0)
MCH: 30.7 pg (ref 26.0–34.0)
MCHC: 33.8 g/dL (ref 30.0–36.0)
MCV: 90.9 fL (ref 78.0–100.0)
Platelets: 295 10*3/uL (ref 150–400)
RBC: 3.42 MIL/uL — ABNORMAL LOW (ref 4.22–5.81)
RDW: 19.5 % — ABNORMAL HIGH (ref 11.5–15.5)
WBC: 15 10*3/uL — ABNORMAL HIGH (ref 4.0–10.5)

## 2013-06-14 LAB — MAGNESIUM: Magnesium: 2.1 mg/dL (ref 1.5–2.5)

## 2013-06-14 LAB — GLUCOSE, CAPILLARY
GLUCOSE-CAPILLARY: 72 mg/dL (ref 70–99)
GLUCOSE-CAPILLARY: 103 mg/dL — AB (ref 70–99)

## 2013-06-14 LAB — ALKALINE PHOSPHATASE ISOENZYMES

## 2013-06-14 MED ORDER — MAGIC MOUTHWASH W/LIDOCAINE
15.0000 mL | Freq: Four times a day (QID) | ORAL | Status: DC
  Administered 2013-06-14 – 2013-06-17 (×5): 15 mL via ORAL
  Filled 2013-06-14 (×16): qty 15

## 2013-06-14 MED ORDER — ALBUMIN HUMAN 5 % IV SOLN
INTRAVENOUS | Status: AC
  Filled 2013-06-14: qty 250

## 2013-06-14 MED ORDER — PANTOPRAZOLE SODIUM 40 MG PO TBEC
40.0000 mg | DELAYED_RELEASE_TABLET | Freq: Two times a day (BID) | ORAL | Status: DC
  Administered 2013-06-14 – 2013-06-17 (×6): 40 mg via ORAL
  Filled 2013-06-14 (×5): qty 1

## 2013-06-14 MED ORDER — DEXAMETHASONE 2 MG PO TABS
2.0000 mg | ORAL_TABLET | Freq: Two times a day (BID) | ORAL | Status: DC
  Administered 2013-06-14 – 2013-06-15 (×3): 2 mg via ORAL
  Filled 2013-06-14 (×5): qty 1

## 2013-06-14 MED ORDER — LEVALBUTEROL HCL 0.63 MG/3ML IN NEBU
0.6300 mg | INHALATION_SOLUTION | Freq: Four times a day (QID) | RESPIRATORY_TRACT | Status: DC | PRN
  Administered 2013-06-15: 0.63 mg via RESPIRATORY_TRACT
  Filled 2013-06-14: qty 3

## 2013-06-14 MED ORDER — POTASSIUM CHLORIDE CRYS ER 20 MEQ PO TBCR
40.0000 meq | EXTENDED_RELEASE_TABLET | Freq: Every day | ORAL | Status: DC
  Administered 2013-06-15 – 2013-06-17 (×3): 40 meq via ORAL
  Filled 2013-06-14 (×3): qty 2

## 2013-06-14 MED ORDER — SODIUM CHLORIDE 0.9 % IN NEBU
3.0000 mL | INHALATION_SOLUTION | RESPIRATORY_TRACT | Status: DC
  Administered 2013-06-14 (×2): 3 mL via RESPIRATORY_TRACT
  Filled 2013-06-14 (×7): qty 3

## 2013-06-14 NOTE — Progress Notes (Signed)
Request for re-goals meeting received. Family meeting for ongoing goals of care scheduled for 11AM 1/2 with Reggie and his sister [name]. Will update the primary team following our discussion.  Lane Hacker, DO Palliative Medicine

## 2013-06-14 NOTE — Progress Notes (Addendum)
Clinical Social Work Department BRIEF PSYCHOSOCIAL ASSESSMENT 06/14/2013  Patient:  Micheal Daniel,Micheal Daniel     Account Number:  [id number]     Admit date:  05/30/2013  Clinical Social Worker:  Freeman Caldron  Date/Time:  06/14/2013 03:01 PM  Referred by:  Physician  Date Referred:  06/14/2013 Referred for  Residential hospice placement   Other Referral:   Interview type:  Patient Other interview type:    PSYCHOSOCIAL DATA Living Status:  FAMILY Admitted from facility:   Level of care:   Primary support name:  Nonie Hoyer ([phone]) Primary support relationship to patient:  SIBLING Degree of support available:   Good--per MD, many family members have been involved since pt's hospitalization. Per chart, pt lives with cousin.    CURRENT CONCERNS Current Concerns  Post-Acute Placement   Other Concerns:    SOCIAL WORK ASSESSMENT / PLAN CSW received call from MD stating pt requesting residential hospice. CSW spoke with pt and he verified he is interested in residential hospice. CSW provided list of facilities, and pt requested CSW send clinicals to the two facilities in [facility] as his family lives in [place]. CSW made referral to [facility] and sent clinicals for referral to [facility]. CSW called pt's sister, as pt requested this, and informed her of CSW referrals. CSW will be in touch with pt and his sister when CSW hears from facilities.   Assessment/plan status:  Psychosocial Support/Ongoing Assessment of Needs Other assessment/ plan:   Information/referral to community resources:   Residential hospice placement.    PATIENT'S/FAMILY'S RESPONSE TO PLAN OF CARE: Pt receptive to CSW visit and engaged in conversation with CSW. Confirmed that he wants residential hospice and wants to stay in [place], as family lives in [place]. Pt sister expressed gratitude for CSW assistance and contact information for facilities if she would like to call them about  information.       Ky Barban, MSW, [facility] Clinical Social Worker [phone]

## 2013-06-14 NOTE — Progress Notes (Signed)
Speech Language Pathology Treatment: Dysphagia  Patient Details Name: Micheal Daniel MRN: [id number] DOB: 03-Apr-1956 Today's Date: 06/14/2013 Time:  -     Assessment / Plan / Recommendation Clinical Impression  Palliative meeting notes reviewed - disposition residential hospice.  Pt currently on limited diet - dysphagia 1, nectar-thick.  Reviewed with him basic information to maximize comfort with PO intake, discussed liberalization of diet per his preferences.  Recommend advancing diet to regular, thin liquids to provide wider range of choices.   No SLP f/u warranted - will sign off.    HPI HPI: 58 yo man with h/o cholelithiasis who presented to [facility] ED with complaints of "sore", intermittent, worsening, non-radiating RUQ abdominal pain x 24h.   He admits to frequent EtOH use. CXR Small left pleural effusion with associated left basilar atelectasis.  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  Pt. coded on 25-Jun-2023, evidently due to hypovolemia due to hepatic hemorrhage. He required intubation until 12/23. Bleeding was controlled by interventional radiology. Since extubation, his voice has been quite hoarse. He is having some difficulty swallowing with some choking. He denies throat pain. ENT performed laryngoscopy 12/31 revealing marked exudative inflammation of the posterior glottis and subglottis with granulation tissue extending to the midline in the mid subglottis as a thin bridge. Inflammatory tissue is in a pattern formed around the endotracheal tube sitting in the posterior glottis. Vocal folds move but sluggishly and the glottis does not close during phonation      SLP Plan  Discharge SLP treatment due to - change to comfort status    Recommendations Diet recommendations: Regular;Thin liquid Liquids provided via: Cup;Straw              Plan: Discharge SLP treatment due to (comment)    Micheal Daniel L. Tivis Ringer, [place] CCC/SLP Pager [phone]       Micheal Daniel 06/14/2013, 4:26 PM

## 2013-06-14 NOTE — Progress Notes (Signed)
Financial counselor states Medicaid application has been started today. When CSW hears back from residential hospice facility(ies), CSW will let financial counselor know what facility pt will go to as they need this information for Medicaid application.   Ky Barban, MSW, [facility] Clinical Social Worker [phone]

## 2013-06-14 NOTE — Progress Notes (Addendum)
Palliative Medicine Team Goals of Care Note  Met with Micheal Daniel, his sister [name] and sister [name] to discuss next steps and ongoing goals of care. 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 Tissue diagnosis was attempted with liver biopsy but he has a subcapsular hemmorhage following the procedure and suffered a cardiac arrest requiring an ICU stay and intubation. He was extubated to NRB and has been stable on O2 but having worsening peripheral edema, abdominal pain, low albumin with intavascular depletion,recurrent infections (SBP), malnutrition and overall decline. Oncology estimated his survival to be <2 months even with a tissue diagnosis-this gentleman could never withstand chemotherapy or extensive interventions-the best we can offer him would be comfort, dignity, and a safe caring environment as he approaches EOL.  [name] told me he knows he is dying and that regardless of what kind of cancer he has it is not curable- he knows how sick he is and has been for some time- he expressed clear understanding of DNR and comfort care approach give his condition-his sisters were present and also have peace knowing he has made his own decision and they support him in that. He agrees to a hospice facility for his care and know his time is very short- he is very interested in getting the full facemask off and getting disconnected as much as possible. He agrees to medications for comfort and that comfort is his primary goal.  1. DNR 2. Full comfort care, focus on QOL 3. No additional interventions o0r procedures, minimize blood draws, d/c CBGs 4. De-escalate medical level care for the sake of his comfort and given the extent of his malignancy and level of debility-once situated and comfortable could be moved to regular floor. 5. Comfort feeding as tolerated-no need to overly restrict his  diet. 6. Residential Hospice referral-CSW order placed. 7. Will change his FFM to nasal cannula-comfort primary goal- will schedule NaCl 0.9% Nebs q4 hours for his inflamed epiglottis/vocal cords. A steroid may be effective and help with his comfort ie. Decrease tumor edema and increase energy level. Will do a trial of low dose decadron oral. 8. No need for continuous monitoring. 9. Ordered magic Mouthwash QID for throat pain  Prognosis: <8 weeks (oncology), I suspect much less than this and shared this with patient and the family.  60 minutes. Greater than 50%  of this time was spent counseling and coordinating care related to the above assessment and plan.  Thank you for allowing our team to assist with the care of this patient and his family. Will follow.  Lane Hacker, DO Palliative Medicine

## 2013-06-14 NOTE — Progress Notes (Signed)
Subjective:   Patient seen and examined at the bedside this morning. He lying in bed with his humidified oxygen mask in place. He appears comfortable. He says his shortness of breath is again improving. He denies chest pain, abdominal pain.  Objective:   Vital signs in last 24 hours: Filed Vitals:   06/14/13 0400 06/14/13 0401 06/14/13 0740 06/14/13 0945  BP: 165/86  157/79   Pulse:   96 77  Temp:  98.9 F (37.2 C) 98.6 F (37 C)   TempSrc:  Oral Oral   Resp:    20  Height:      Weight:      SpO2:   96% 97%   Weight change:   Intake/Output Summary (Last 24 hours) at 06/14/13 1004 Last data filed at 06/14/13 0600  Gross per 24 hour  Intake    403 ml  Output    575 ml  Net   -172 ml    Physical Exam: Constitutional: No distress.  Head: Normocephalic and atraumatic Eyes: PERRL, EOMI, +scleral icterus  Neck: Supple, Trachea midline. Still with hoarse voice. Cardiovascular: Regular rate and rhythm, S1, S2 present, no MRG, DP 2+ b/l; improvement in pitting edema up to the thighs b/l, improvement in genital/scrotal edema Pulmonary/Chest: normal respiratory effort, inspiratory wheezes still noted in upper airway, soft crackles at right lung base still present, SpO2 97% on aerosolized mask FiO2; no gynecomastia Abdominal: Soft. +BS, no tenderness to palpation, mildly distended Musculoskeletal: No joint deformities, erythema, or stiffness Neurological: A&O x3, cranial nerve II-XII are grossly intact, moving all extremities Skin: Warm, dry and intact. No rash Psychiatric: Normal mood and affect.   Lab Results:  BMP:  Recent Labs Lab 06/13/13 0410 06/13/13 1545 06/14/13 0605  NA 147 149* 148*  K 3.7 4.3 4.0  CL 112 111 111  CO2 20 27 20   GLUCOSE 82 109* 83  BUN 21 19 18   CREATININE 0.89 0.97 0.83  CALCIUM 9.0 9.3 9.1  MG 2.2  --  2.1    CBC:  Recent Labs Lab 06/13/13 0410 06/14/13 0605  WBC 17.7* 15.0*  HGB 10.8* 10.5*  HCT 32.5* 31.1*  MCV 91.3  90.9  PLT 259 295    Coagulation:  Recent Labs Lab 06/09/13 0605  LABPROT 14.0  INR 1.10    CBG:            Recent Labs Lab 06/12/13 2136 06/13/13 0854 06/13/13 1230 06/13/13 1649 06/13/13 2130 06/14/13 0747  GLUCAP 94 77 100* 98 101* 72          LFTs:  Recent Labs Lab 06/08/13 0450 06/12/13 1100  AST 187* 218*  ALT 87* 78*  ALKPHOS 458* 373*  BILITOT 3.1* 5.2*  PROT 6.9 7.0  ALBUMIN 1.8* 2.1*    Cardiac Enzymes: No results found for this basename: CKTOTAL, CKMB, CKMBINDEX, TROPONINI,  in the last 168 hours Lab Results  Component Value Date   CKTOTAL 232 05/31/2013   CKMB 3.9 05/31/2013   TROPONINI <0.30 06/01/2013    EKG:  Date/Time:  Thursday May 30 2013 09:46:47 EST Ventricular Rate:  82 PR Interval:  138 QRS Duration: 72 QT Interval:  388 QTC Calculation: 453 R Axis:   68 Text Interpretation:  Sinus rhythm Normal ECG No significant change since last tracing Confirmed by Christy Gentles  MD, [place] ([id number]) on 05/30/2013 10:02:53 AM   Micro Results: Recent Results (from the past 240 hour(s))  URINE CULTURE     Status: None  Collection Time    06/09/13  4:43 PM      Result Value Range Status   Specimen Description URINE, RANDOM   Final   Special Requests NONE   Final   Culture  Setup Time     Final   Value: 06/09/2013 22:56     Performed at [facility]     Final   Value: >=100,000 COLONIES/ML     Performed at [facility]   Culture     Final   Value: ESCHERICHIA COLI     Performed at [facility]   Report Status 06/11/2013 FINAL   Final   Organism ID, Bacteria ESCHERICHIA COLI   Final  CULTURE, BLOOD (ROUTINE X 2)     Status: None   Collection Time    06/09/13  9:45 PM      Result Value Range Status   Specimen Description BLOOD RIGHT ARM   Final   Special Requests BOTTLES DRAWN AEROBIC ONLY 10CC   Final   Culture  Setup Time     Final   Value: 06/10/2013 11:16     Performed at [facility]   Culture     Final   Value:        BLOOD CULTURE RECEIVED NO GROWTH TO DATE CULTURE WILL BE HELD FOR 5 DAYS BEFORE ISSUING A FINAL NEGATIVE REPORT     Performed at [facility]   Report Status PENDING   Incomplete  CULTURE, BLOOD (ROUTINE X 2)     Status: None   Collection Time    06/09/13  9:55 PM      Result Value Range Status   Specimen Description BLOOD LEFT HAND   Final   Special Requests BOTTLES DRAWN AEROBIC ONLY 10CC   Final   Culture  Setup Time     Final   Value: 06/10/2013 11:16     Performed at [facility]   Culture     Final   Value:        BLOOD CULTURE RECEIVED NO GROWTH TO DATE CULTURE WILL BE HELD FOR 5 DAYS BEFORE ISSUING A FINAL NEGATIVE REPORT     Performed at [facility]   Report Status PENDING   Incomplete  BODY FLUID CULTURE     Status: None   Collection Time    06/11/13  2:16 PM      Result Value Range Status   Specimen Description ASCITIC ABDOMEN FLUID   Final   Special Requests 60ML FLUID   Final   Gram Stain     Final   Value: RARE WBC PRESENT,BOTH PMN AND MONONUCLEAR     NO ORGANISMS SEEN     Performed at [facility]   Culture     Final   Value: NO GROWTH 2 DAYS     Performed at [facility]   Report Status PENDING   Incomplete  MRSA PCR SCREENING     Status: None   Collection Time    06/12/13  7:00 PM      Result Value Range Status   MRSA by PCR NEGATIVE  NEGATIVE Final   Comment:            The GeneXpert MRSA Assay (FDA     approved for NASAL specimens     only), is one component of a     comprehensive MRSA colonization     surveillance program. It is not  intended to diagnose MRSA     infection nor to guide or     monitor treatment for     MRSA infections.    Medications:  Scheduled Meds: . albumin human  25 g Intravenous BID  . cefTRIAXone (ROCEPHIN)  IV  2 g Intravenous Q24H  . dextromethorphan-guaiFENesin  1 tablet Oral BID  . feeding supplement (ENSURE COMPLETE)  237 mL  Oral BID BM  . folic acid  1 mg Oral Daily  . furosemide  40 mg Intravenous Daily  . ipratropium  0.5 mg Nebulization Q6H  . levalbuterol  0.63 mg Nebulization Q6H  . multivitamin with minerals  1 tablet Oral Daily  . pantoprazole (PROTONIX) IV  40 mg Intravenous Q12H  . potassium chloride SA  40 mEq Oral BID  . sodium chloride  3 mL Intravenous Q12H  . spironolactone  100 mg Oral Daily  . thiamine  100 mg Oral Daily   Continuous Infusions:  PRN Meds:.sodium chloride, diphenhydrAMINE, food thickener, sodium chloride  Antibiotics: Anti-infectives   Start     Dose/Rate Route Frequency Ordered Stop   06/09/13 2000  cefTRIAXone (ROCEPHIN) 2 g in dextrose 5 % 50 mL IVPB     2 g 100 mL/hr over 30 Minutes Intravenous Every 24 hours 06/09/13 1856       Antibiotics Given (last 72 hours)   Date/Time Action Medication Dose Rate   06/11/13 2050 Given   cefTRIAXone (ROCEPHIN) 2 g in dextrose 5 % 50 mL IVPB 2 g 100 mL/hr   06/12/13 2100 Given   cefTRIAXone (ROCEPHIN) 2 g in dextrose 5 % 50 mL IVPB 2 g 100 mL/hr   06/13/13 2054 Given   cefTRIAXone (ROCEPHIN) 2 g in dextrose 5 % 50 mL IVPB 2 g 100 mL/hr       Day of Hospitalization:  15 Consults: Treatment Team:  Palliative Beola Cord, MD  Assessment/Plan:   #Multiple hepatic masses with omental caking, 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 He has a history of hepatitis C and his AFP is significantly elevated at 670.9. Ca 19-9 = 96.2 (also high). [place] could be the primary; however, CT appearance looks atypical. Esophageal primary ruled out with EGD. Our big issue right now is making a tissue diagnosis to guide prognosis, given his recent near-fatal perihepatic bleed. Patient does not want to pursue a liver biopsy 2/2 risks. We therefore performed a diagnostic paracentesis on 12/30. It showed elevated LDH (2509) suggestive of malignant effusion and SAAG = 0.90 consistent with  carcinomatosis. Unfortunately, the sample was not apparently sufficient for a tissue diagnosis via cytology. Pathology report shows scattered atypical cells not quantitatively diagnostic of malignancy, and too scant of tissue present for additional studies. I spoke with Dr. Marin Olp who predicts survival <2 months given pre-albumin of 7.7. That being said, a tissue diagnosis would still be needed for a definitive answer about prognosis and treatment options (which will most likely be very limited). A possible next step could be re-consulting IR or GI to biopsy the omentum. Palliative care has been following (Dr.Taylor) since his time in the ICU and will meet again with the patient to re-evaluate goals of care. I have updated Dr. Hilma Favors as well as Dr. Lovena Le. - Appreciate medical oncology recs - Appreciate palliative care, awaiting recs for this difficult situation - Continue PT/OT  #SBP - Paracentesis fluid cell count shows 5560 WBC, 46% neutrophils, consistent with SBP. Culture with NGTD, but patient was  already on ceftriaxone 2g IV daily for pyelonephritis at the time of the tap. Abdominal pain is gone and all vital signs are stable. He is afebrile. - Continue Ceftriaxone 2g IV q24hr for SBP, day 4/5, if fever or abdominal pain recurs will repeat paracentesis - Continue albumin 25% 25g x2 for a total of 50g (1g/kg), day 3/3, done this evening - Will need Bactrim 1DS tablet qd for prophylaxis going forward  #Hoarseness 2/2 posterior glottic and subgottic inflammation - Patient has suffered from hoarseness and cough since being extubated on 12/23. ENT evaluation showed marked inflammatory, granulation changes of the posterior glottis and subgottis due to intubation, impeding normal vocal fold mobility, and partially blocking the airway. Dr. Redmond Baseman feels this should improve with time. If breathing were to become difficult, steroids and epinephrine nebs could be tried but tracheostomy would possibly be  required. SLP evaluated the patient and recommend conservative diet of Dys 1 and nectar thick liquids until it starts to improve. - Appreciate ENT recs - Continuous pulse oximetry - Dysphagia 1 (Puree);Nectar-thick liquid diet - Protonix 40mg  IV BID - Humidified face mask oxygen - Dr. Redmond Baseman will re-scope him next week as inpatient or outpatient depending on discharge plan  #Weight gain, resolving - Likely secondary to fluid overload and malignant ascites. Scrotal edema and abdominal ascites have mostly resolved. He still has pitting edema in the lower extremities. Weight is down trending with diuretics (see below) and volume status is improving clinically. He is s/p paracentesis on 12/30. Weight on admission was ~105lbs. - Daily weights  - Strict ins and outs (of note, nurse has had trouble with this 2/2 his urinary urgency) - Lasix 40mg  IV daily, will consider transitioning to PO in the next few days - Spironolactone 100mg  po daily - Continue scrotal support  Filed Weights   06/11/13 0459 06/12/13 0425 06/13/13 1100  Weight: 151 lb 0.2 oz (68.5 kg) 135 lb 6.4 oz (61.417 kg) 130 lb 1.1 oz (59 kg)    #Complicated UTI vs. pyelonephritis - Patient noted to have flank pain, mild dysuria, and gross hematuria on 12/18. UA was positive for nitrites and LE, many bacteria, few squams. Presentation suggestive of UTI vs. Pyelonephritis, likely associated with having a Foley catheter while he was in the ICU. Urine culture growing >100K E. Coli, pan-sensitive. - Follow up blood culture > NGTD - Continue Ceftriaxone 2g IV q24hr  #Hypernatremia - Likely 2/2 Lasix. - Continue to monitor - Will consider reducing lasix dose if Na exceeds 152  Sodium  Date Value Range Status  06/14/2013 148* 137 - 147 mEq/L Final     Please note change in reference range.  06/13/2013 149* 137 - 147 mEq/L Final     Please note change in reference range.  06/13/2013 147  137 - 147 mEq/L Final     Please note change in  reference range.  06/12/2013 147  137 - 147 mEq/L Final     Please note change in reference range.  06/12/2013 151* 137 - 147 mEq/L Final     Please note change in reference range.    #Leukocytosis - Trending down, see below. Patient has a UTI/pyelonephritis, SBP, and has been on systemic steroids, which we recently tapered off. He is afebrile, AVSS. - Continue antibiotics as above - Continue to monitor  WBC  Date Value Range Status  06/14/2013 15.0* 4.0 - 10.5 K/uL Final  06/13/2013 17.7* 4.0 - 10.5 K/uL Final  06/12/2013 19.1* 4.0 - 10.5 K/uL Final  06/11/2013 17.1* 4.0 - 10.5 K/uL Final  06/10/2013 20.0* 4.0 - 10.5 K/uL Final    #Shock due to perihepatic hemorrhage, resolved - Patient is s/p VT arrest/Code Blue on 12/20 secondary to hemorrhagic shock from perihepatic hemorrhage from subcapsular lesion. IR performed emergent hepatic/mesentericGel-Foam and coil embolization. Vital signs are currently stable. Hgb stable but with slight recent down trend as below, perhaps due to hemorrhoidal bleeding. On 12/27 PT was 14.0, INR 1.10. - Daily CBC  Hemoglobin  Date Value Range Status  06/14/2013 10.5* 13.0 - 17.0 g/dL Final  06/13/2013 10.8* 13.0 - 17.0 g/dL Final  06/12/2013 11.5* 13.0 - 17.0 g/dL Final  06/11/2013 12.1* 13.0 - 17.0 g/dL Final  06/10/2013 12.0* 13.0 - 17.0 g/dL Final   #Rectal bleeding - On 12/31 nurse noted small amount of noticeable bright red blood mix with soft but formed brown stool. Likely 2/2 hemorrhoidal bleed/portal hypertension due to appearance, in which case banding is contraindicated. Hemoglobin stable as above. - Continue to monitor and trend hemoglobin  #Hypokalemia-  Stable as below. - Continue K-dur 16meq BID - Reducing BMP frequency to daily  Potassium  Date Value Range Status  06/14/2013 4.0  3.7 - 5.3 mEq/L Final     Please note change in reference range.  06/13/2013 4.3  3.7 - 5.3 mEq/L Final     Please note change in reference range.  06/13/2013 3.7   3.7 - 5.3 mEq/L Final     Please note change in reference range.  06/12/2013 3.7  3.7 - 5.3 mEq/L Final     Please note change in reference range.  06/12/2013 3.4* 3.7 - 5.3 mEq/L Final     Please note change in reference range.    #Adrenal insufficiency? - There was concern for adrenal insufficiency when he was in the unit. 06/01/13 Cortisol = 16.1, which is wnl. We have tapered off his steroids since that time.  #Severe malnutrition in the context of chronic illness - Nutrition consulted and recommended ensure complete po bid which was ordered. Prealbumin 7.7 (low). - Appreciate RD recommendations  #Financial issues - Patient is uninsured and does not have a PCP. He rents a room from a friend. - Financial counselor following to see if patient will qualify for Medicaid  - CM following, have given patient [place] letter for mediations once we are ready for discharge - Patient to follow up in [facility]  #Code status - FULL CODE. Re-confirmed this today with the patient and his family.   #Dispo- Disposition is deferred at this time.  Anticipated discharge in approximately 3-5 days.    LOS: 15 days   Lesly Dukes, MD PGY-1, Internal Medicine  [phone] 06/14/2013, 10:04 AM

## 2013-06-14 NOTE — Discharge Summary (Signed)
Name: Micheal Daniel MRN: [id number] DOB: Oct 16, 1955 58 y.o. PCP: No Pcp Per Patient  Date of Admission: 05/30/2013  8:21 AM Date of Discharge: 06/17/2013 Attending Physician: Karren Cobble, MD  Discharge Diagnosis: Principal Problem:   RUQ abdominal pain associated with multiple hepatic masses with omental caking, lower paraesophageal and upper abdominal nodal metastases, unknown primary cancer Active Problems:   Anemia   Mass of multiple sites of liver   Transaminitis   Protein calorie malnutrition   Elevated INR   Hypernatremia   Protein-calorie malnutrition, severe   Acute respiratory failure   Encephalopathy acute   Cardiac arrest   Acute pyelonephritis   SBP (spontaneous bacterial peritonitis)   Advanced directives, counseling/discussion   Palliative care encounter  Discharge Medications:   Medication List         ALPRAZolam 0.5 MG tablet  Commonly known as:  XANAX  Take 1 tablet (0.5 mg total) by mouth every 4 (four) hours as needed for anxiety or sleep.     dexamethasone 2 MG tablet  Commonly known as:  DECADRON  Take 1 tablet (2 mg total) by mouth daily.     dextromethorphan-guaiFENesin 30-600 MG per 12 hr tablet  Commonly known as:  MUCINEX DM  Take 1 tablet by mouth 2 (two) times daily as needed for cough.     DSS 100 MG Caps  Take 100 mg by mouth 2 (two) times daily as needed for mild constipation.     folic acid 1 MG tablet  Commonly known as:  FOLVITE  Take 1 tablet (1 mg total) by mouth daily.     furosemide 40 MG tablet  Commonly known as:  LASIX  Take 1 tablet (40 mg total) by mouth daily.     levalbuterol 0.63 MG/3ML nebulizer solution  Commonly known as:  XOPENEX  Take 3 mLs (0.63 mg total) by nebulization every 6 (six) hours as needed for wheezing or shortness of breath.     magic mouthwash w/lidocaine Soln  Take 15 mLs by mouth 4 (four) times daily.     naproxen sodium 220 MG tablet  Commonly known as:  ANAPROX  Take 220 mg by  mouth every 6 (six) hours as needed (for pain).     oxyCODONE 5 MG immediate release tablet  Commonly known as:  Oxy IR/ROXICODONE  Take 1 tablet (5 mg total) by mouth every 4 (four) hours as needed for moderate pain or severe pain (Dyspnea).     pantoprazole 40 MG tablet  Commonly known as:  PROTONIX  Take 1 tablet (40 mg total) by mouth 2 (two) times daily.     potassium chloride SA 20 MEQ tablet  Commonly known as:  K-DUR,KLOR-CON  Take 2 tablets (40 mEq total) by mouth daily.     sorbitol 70 % Soln  Take 30 mLs by mouth at bedtime.     spironolactone 100 MG tablet  Commonly known as:  ALDACTONE  Take 1 tablet (100 mg total) by mouth daily.     thiamine 100 MG tablet  Take 1 tablet (100 mg total) by mouth daily.        Disposition and follow-up:   Mr.Micheal Daniel was discharged from [facility] in Stable condition.  At the hospital follow up visit please address:  1.  Comfort care only. Focus on quality of life.  2.  Labs / imaging needed at time of follow-up: None  3.  Pending labs/ test needing follow-up: None  Follow-up  Appointments:   Discharge Instructions: Discharge Orders   Future Orders Complete By Expires   Diet - low sodium heart healthy  As directed    Increase activity slowly  As directed       Consultations: Treatment Team:  Palliative Beola Cord, MD Medical Oncology ENT Palliative Care Interventional Radiology GI  Procedures Performed:  Dg Chest 2 View  06/10/2013   CLINICAL DATA:  Shortness of breath  EXAM: CHEST  2 VIEW  COMPARISON:  Prior radiograph from 06/04/2013  FINDINGS: Endotracheal, enteric, and right IJ central venous catheter have been removed since prior. The cardiac and mediastinal silhouettes are stable in size and contour, and remain within normal limits.  Lungs are mildly hypoinflated. There has been interval improvement in previously seen bilateral pleural effusions and pulmonary edema. A  small left pleural effusion persists. No frank pulmonary edema. Linear opacity within the left perihilar region most likely reflects atelectasis. No definite focal infiltrates identified. No pneumothorax.  Osseous structures and soft tissues are unchanged.  IMPRESSION: Small left pleural effusion with associated left basilar atelectasis.   Electronically Signed   By: Jeannine Boga M.D.   On: 06/10/2013 16:04   Dg Chest 2 View  05/30/2013   CLINICAL DATA:  Concerning areas found within the liver.  EXAM: CHEST  2 VIEW  COMPARISON:  None.  FINDINGS: Low lung volumes. The cardiac silhouette is in normal limits. Areas of a ill-defined nodular density project within the right mid lung, right upper lobe region, and left lung base. Further evaluation chest CT project fluid considering the liver findings is recommended. No focal regions of consolidation identified. Visualized osseous structures are unremarkable.  IMPRESSION: Multiple areas of ill-defined vague densities with a nodular appearance. Further evaluation with contrasted chest CT is recommended.   Electronically Signed   By: Margaree Mackintosh M.D.   On: 05/30/2013 14:48   Ct Chest W Contrast  05/31/2013   ADDENDUM REPORT: 05/31/2013 14:18  ADDENDUM: On additional review, one of the subcapsular metastases within the anterior segment right hepatic lobe may be hemorrhaging along the right hepatic lobe (series 7/image 30; series 11/image 27). As such, the high density material along the right liver is favored to reflect perihepatic hemorrhage rather than peritoneal disease (series 7/image 46).  ADDENDED IMPRESSION:  Suspected active perihepatic hemorrhage of a subcapsular lesion in the anterior segment right hepatic lobe. Associated moderate perihepatic hemorrhage along the right hepatic lobe.  Mucosal irregularity/eccentric wall thickening involving the distal esophagus just above the GE junction, worrisome for primary esophageal neoplasm. Endoscopic  correlation is suggested.  Innumerable hepatic metastases, as described above.  Suspected lower paraesophageal and upper abdominal nodal metastases.  Moderate abdominopelvic ascites.  These results were called by telephone at the time of interpretation on 05/31/2013 at 2:18 PM to Dr. Bertha Stakes , who verbally acknowledged these results.   Electronically Signed   By: Julian Hy M.D.   On: 05/31/2013 14:18   05/31/2013   CLINICAL DATA:  Right upper quadrant pain, frequent ethanol use, concern for hepatic metastases on ultrasound  EXAM: CT CHEST, ABDOMEN, AND PELVIS WITH CONTRAST  TECHNIQUE: Multidetector CT imaging of the chest, abdomen and pelvis was performed following the standard protocol during bolus administration of intravenous contrast.  CONTRAST:  144m OMNIPAQUE IOHEXOL 300 MG/ML  SOLN  COMPARISON:  Ultrasound abdomen dated 05/30/2013  FINDINGS: CT CHEST FINDINGS  Mild dependent atelectasis in the bilateral lower lobes. Mild paraseptal emphysematous changes. No suspicious pulmonary nodules. Trace bilateral  pleural effusions. No pneumothorax.  Visualized thyroid is unremarkable.  The heart is normal in size.  No suspicious mediastinal, hilar, or axillary lymphadenopathy. Abnormal soft tissue near the GE junction, measuring up to 2.7 x 2.1 cm (series 2/ image 39), suspicious for lower paraesophageal nodes.  Esophagus is dilated and filled with contrast. 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 Additional mucosal irregularity/eccentric wall thickening involving the distal esophagus just above the GE junction (series 2/image 39;coronal image 47), worrisome for primary esophageal neoplasm.  Degenerative changes of the thoracic spine.  CT ABDOMEN AND PELVIS FINDINGS  Mild gastric distention with contrast.  Innumerable hepatic metastases throughout both lobes. Dominant lesions include 5.2 x 4.9 cm lesion in the anterior  segment right hepatic lobe (series 7/ image 21) and a 3.9 x 4.5 cm lesion in the medial segment left hepatic lobe (series 7/ image 18).  Spleen, pancreas, and adrenal glands are within normal limits.  Gallbladder is notable for suspected layering gallstone (series 7/image 39), without associated inflammatory changes. No intrahepatic or extrahepatic ductal dilatation.  Kidneys are within normal limits.  No hydronephrosis.  No evidence of bowel obstruction. No convincing colonic mass is seen.  Atherosclerotic calcifications of the abdominal aorta and branch vessels.  Moderate abdominopelvic ascites. Associated omental caking/peritoneal disease overlying the inferior liver (series 7/ image 46).  Suspected nodal metastases in the porta hepatis measuring 2.9 and 3.7 cm short axis (series 7/ image 27).  Prostate is unremarkable.  Bladder is within normal limits.  Small right inguinal/scrotal hernia with fat/ fluid (series 7/image 85).  Mild degenerative changes of the lumbar spine. Mildly heterogeneous appearance of the bilateral iliac bones (series 7/image 60).  IMPRESSION: Mucosal irregularity/eccentric wall thickening involving the distal esophagus just above the GE junction, worrisome for primary esophageal neoplasm. Endoscopic correlation is suggested.  Innumerable hepatic metastases, as described above.  Suspected lower paraesophageal and upper abdominal nodal metastases.  Moderate abdominopelvic ascites with associated omental caking/peritoneal disease overlying the inferior liver.  Electronically Signed: By: Julian Hy M.D. On: 05/30/2013 19:17   US Abdomen Complete  05/30/2013   CLINICAL DATA:  Pain.  EXAM: ULTRASOUND ABDOMEN COMPLETE  COMPARISON:  12/04/2012 abdominal ultrasound.  FINDINGS: Gallbladder:  Again noted is sludge in the gallbladder. Gallbladder wall thickness is normal at 1.9 mm. Negative Murphy's sign. No pericholecystic fluid.  Common bile duct:  Diameter: 3.1 mm.  Liver:  Echotexture  is grossly abnormal with severe heterogeneity. Probable hepatomegaly. There are multiple hepatic masses, many of which are hyperechoic. The largest on today's exam measures 3.9 cm in maximum diameter. These lesions are very suspicious for metastatic disease. Portal vein patent with normal directional flow. CT of the abdomen and pelvis suggested for further evaluation.  IVC:  No abnormality visualized.  Pancreas:  Visualized portion unremarkable.  Spleen:  Size and appearance within normal limits.  Right Kidney:  Length: 9.7 cm. Echogenicity within normal limits. No mass or hydronephrosis visualized.  Left Kidney:  Length: 9.2 cm. Echogenicity within normal limits. No mass or hydronephrosis visualized.  Abdominal aorta:  No aneurysm visualized.  Other findings:  Complex ascites.  This suggests malignant ascites.  IMPRESSION: Innumerable hepatic masses consistent with metastatic disease. Associated ascites present. CT of the abdomen pelvis suggested for further evaluation.   Electronically Signed   By: Marcello Moores  Register   On: 05/30/2013 09:58   Ct Abdomen Pelvis W Contrast  05/31/2013   ADDENDUM REPORT: 05/31/2013 14:18  ADDENDUM: On additional review, one of the subcapsular metastases within the anterior segment right hepatic lobe may be hemorrhaging along the right hepatic lobe (series 7/image 30; series 11/image 27). As such, the high density material along the right liver is favored to reflect perihepatic hemorrhage rather than peritoneal disease (series 7/image 46).  ADDENDED IMPRESSION:  Suspected active perihepatic hemorrhage of a subcapsular lesion in the anterior segment right hepatic lobe. Associated moderate perihepatic hemorrhage along the right hepatic lobe.  Mucosal irregularity/eccentric wall thickening involving the distal esophagus just above the GE junction, worrisome for primary esophageal neoplasm. Endoscopic correlation is suggested.  Innumerable hepatic metastases, as described above.   Suspected lower paraesophageal and upper abdominal nodal metastases.  Moderate abdominopelvic ascites.  These results were called by telephone at the time of interpretation on 05/31/2013 at 2:18 PM to Dr. Bertha Stakes , who verbally acknowledged these results.   Electronically Signed   By: Julian Hy M.D.   On: 05/31/2013 14:18   05/31/2013   CLINICAL DATA:  Right upper quadrant pain, frequent ethanol use, concern for hepatic metastases on ultrasound  EXAM: CT CHEST, ABDOMEN, AND PELVIS WITH CONTRAST  TECHNIQUE: Multidetector CT imaging of the chest, abdomen and pelvis was performed following the standard protocol during bolus administration of intravenous contrast.  CONTRAST:  145m OMNIPAQUE IOHEXOL 300 MG/ML  SOLN  COMPARISON:  Ultrasound abdomen dated 05/30/2013  FINDINGS: CT CHEST FINDINGS  Mild dependent atelectasis in the bilateral lower lobes. Mild paraseptal emphysematous changes. No suspicious pulmonary nodules. Trace bilateral pleural effusions. No pneumothorax.  Visualized thyroid is unremarkable.  The heart is normal in size.  No suspicious mediastinal, hilar, or axillary lymphadenopathy. Abnormal soft tissue near the GE junction, measuring up to 2.7 x 2.1 cm (series 2/ image 39), suspicious for lower paraesophageal nodes.  Esophagus is dilated and filled with contrast. 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 Additional mucosal irregularity/eccentric wall thickening involving the distal esophagus just above the GE junction (series 2/image 39;coronal image 47), worrisome for primary esophageal neoplasm.  Degenerative changes of the thoracic spine.  CT ABDOMEN AND PELVIS FINDINGS  Mild gastric distention with contrast.  Innumerable hepatic metastases throughout both lobes. Dominant lesions include 5.2 x 4.9 cm lesion in the anterior segment right hepatic lobe (series 7/ image 21) and a 3.9 x 4.5 cm lesion in the  medial segment left hepatic lobe (series 7/ image 18).  Spleen, pancreas, and adrenal glands are within normal limits.  Gallbladder is notable for suspected layering gallstone (series 7/image 39), without associated inflammatory changes. No intrahepatic or extrahepatic ductal dilatation.  Kidneys are within normal limits.  No hydronephrosis.  No evidence of bowel obstruction. No convincing colonic mass is seen.  Atherosclerotic calcifications of the abdominal aorta and branch vessels.  Moderate abdominopelvic ascites. Associated omental caking/peritoneal disease overlying the inferior liver (series 7/ image 46).  Suspected nodal metastases in the porta hepatis measuring 2.9 and 3.7 cm short axis (series 7/ image 27).  Prostate is unremarkable.  Bladder is within normal limits.  Small right inguinal/scrotal hernia with fat/ fluid (series 7/image 85).  Mild degenerative changes of the lumbar spine. Mildly heterogeneous appearance of the bilateral iliac bones (series 7/image 60).  IMPRESSION: Mucosal irregularity/eccentric wall thickening involving the distal esophagus just above the GE junction, worrisome for primary esophageal neoplasm. Endoscopic correlation is suggested.  Innumerable hepatic metastases, as described above.  Suspected lower paraesophageal and upper abdominal nodal metastases.  Moderate abdominopelvic ascites with associated omental caking/peritoneal disease overlying the inferior liver.  Electronically Signed: By: Julian Hy M.D. On: 05/30/2013 19:17   Ir Angiogram Visceral Selective  06/01/2013   CLINICAL DATA:  Massive intraperitoneal hemorrhage secondary to active extravasation from a a liver lesion. The patient went into hypovolemic shock and cardiac arrest and was resuscitated. Emergency consent for hepatic embolization is requested. I am in agreement with the critical care physician.  EXAM: SELECTIVE VISCERAL ARTERIOGRAPHY; IR ULTRASOUND GUIDANCE VASC ACCESS LEFT; TRANSCATHETER  THERAPY EMBOLIZATION; ARTERIOGRAPHY  MEDICATIONS AND MEDICAL HISTORY: Ventilated  FLUOROSCOPY TIME:  18 min and 24 seconds.  PROCEDURE: The procedure, risks, benefits, and alternatives were explained to the patient. Questions regarding the procedure were encouraged and answered. The patient understands and consents to the procedure.  The left groin was prepped with Betadine in a sterile fashion, and a sterile drape was applied covering the operative field. A sterile gown and sterile gloves were used for the procedure.  Under sonographic guidance, a micropuncture needle was inserted into the left common femoral artery and removed over a 018 wire which was up sized to a Bentson. A 5 French sheath was inserted. A cobra 2 catheter was advanced over the wire to the upper aorta. The celiac was engaged. Cobra was advanced over the wire into the common hepatic artery. Angiography was performed. This demonstrated active extravasation in the posterior segment of the right lobe of the liver.  The cobra catheter was advanced beyond the gastroduodenal artery origin into the proper hepatic artery and additional angiographic imaging was performed.  After determining the suspect vessel, a micro catheter was advanced through the Cobra catheter and over an 018 glidewire into a sub selective branch in the right lobe of the liver. Gel-Foam slurry was utilized for embolization. A single 2 x 5 mm coil was deployed. Follow-up angiogram through the micro catheter demonstrates a wedge-shaped hypoperfused segment in the right lobe corresponding to the area of active extravasation.  The micro catheter was then utilized to select the other 2 large branches to the right lobe can repeat angiography confirmed no other source of active extravasation.  The cobra catheter was removed. The left groin 5 French sheath was flushed and secured in place. INR performed this morning was determined be 2.7. Angiography of the left common femoral artery in  preparation for EXOSEAL was performed.  COMPLICATIONS: None  FINDINGS: Angiography through the common hepatic artery delineates anatomy.  Subsequent imaging of the proper hepatic artery demonstrates active extravasation of contrast within the right lobe of the liver corresponding to the abnormality noted on CT.  Subsequent imaging demonstrates both Gel-Foam and coil embolization of the suspect vessel. Repeat angiography demonstrates resolution of the active extravasation.  Subsequent injection of other 3rd order vessels demonstrates no other source of active extravasation.  IMPRESSION: Diagnostic hepatic arterial angiography confirms active extravasation of contrast within the right lobe of the liver. The source vessel was selectively embolize utilizing Gel-Foam slurry and a single platinum coil. This was successful in eliminating active extravasation of bleeding from the liver parenchyma.  : CONTRAST:  100 cc Omnipaque 300   Electronically Signed   By: Maryclare Bean M.D.   On: 06/01/2013 12:52   Ir Angiogram Follow Up Study  06/01/2013   CLINICAL DATA:  Massive intraperitoneal hemorrhage secondary to active extravasation from a a liver lesion. The patient went into hypovolemic shock and cardiac arrest and was resuscitated. Emergency consent for hepatic embolization is requested. I am  in agreement with the critical care physician.  EXAM: SELECTIVE VISCERAL ARTERIOGRAPHY; IR ULTRASOUND GUIDANCE VASC ACCESS LEFT; TRANSCATHETER THERAPY EMBOLIZATION; ARTERIOGRAPHY  MEDICATIONS AND MEDICAL HISTORY: Ventilated  FLUOROSCOPY TIME:  18 min and 24 seconds.  PROCEDURE: The procedure, risks, benefits, and alternatives were explained to the patient. Questions regarding the procedure were encouraged and answered. The patient understands and consents to the procedure.  The left groin was prepped with Betadine in a sterile fashion, and a sterile drape was applied covering the operative field. A sterile gown and sterile gloves were  used for the procedure.  Under sonographic guidance, a micropuncture needle was inserted into the left common femoral artery and removed over a 018 wire which was up sized to a Bentson. A 5 French sheath was inserted. A cobra 2 catheter was advanced over the wire to the upper aorta. The celiac was engaged. Cobra was advanced over the wire into the common hepatic artery. Angiography was performed. This demonstrated active extravasation in the posterior segment of the right lobe of the liver.  The cobra catheter was advanced beyond the gastroduodenal artery origin into the proper hepatic artery and additional angiographic imaging was performed.  After determining the suspect vessel, a micro catheter was advanced through the Cobra catheter and over an 018 glidewire into a sub selective branch in the right lobe of the liver. Gel-Foam slurry was utilized for embolization. A single 2 x 5 mm coil was deployed. Follow-up angiogram through the micro catheter demonstrates a wedge-shaped hypoperfused segment in the right lobe corresponding to the area of active extravasation.  The micro catheter was then utilized to select the other 2 large branches to the right lobe can repeat angiography confirmed no other source of active extravasation.  The cobra catheter was removed. The left groin 5 French sheath was flushed and secured in place. INR performed this morning was determined be 2.7. Angiography of the left common femoral artery in preparation for EXOSEAL was performed.  COMPLICATIONS: None  FINDINGS: Angiography through the common hepatic artery delineates anatomy.  Subsequent imaging of the proper hepatic artery demonstrates active extravasation of contrast within the right lobe of the liver corresponding to the abnormality noted on CT.  Subsequent imaging demonstrates both Gel-Foam and coil embolization of the suspect vessel. Repeat angiography demonstrates resolution of the active extravasation.  Subsequent injection of  other 3rd order vessels demonstrates no other source of active extravasation.  IMPRESSION: Diagnostic hepatic arterial angiography confirms active extravasation of contrast within the right lobe of the liver. The source vessel was selectively embolize utilizing Gel-Foam slurry and a single platinum coil. This was successful in eliminating active extravasation of bleeding from the liver parenchyma.  : CONTRAST:  100 cc Omnipaque 300   Electronically Signed   By: Maryclare Bean M.D.   On: 06/01/2013 12:52   US Paracentesis  06/11/2013   CLINICAL DATA:  58 year old with multiple liver lesions and hemoperitoneum. Request for diagnostic paracentesis.  EXAM: ULTRASOUND GUIDED PARACENTESIS  COMPARISON:  None.  PROCEDURE: An ultrasound guided paracentesis was thoroughly discussed with the patient and questions answered. The benefits, risks, alternatives and complications were also discussed. The patient understands and wishes to proceed with the procedure. Written consent was obtained.  Ultrasound was performed to localize and mark an adequate pocket of fluid in the right lower quadrant of the abdomen. The area was then prepped and draped in the normal sterile fashion. 1% Lidocaine was used for local anesthesia. Under ultrasound guidance a 19 gauge Teressa Lower  catheter was introduced. Paracentesis was performed. The catheter was removed and a dressing applied.  Complications: None.  FINDINGS: A total of 210 ml of bloody fluid was removed. A fluid sample was sent for laboratory analysis.  IMPRESSION: Successful ultrasound guided paracentesis yielding 210 ml of bloody fluid.   Electronically Signed   By: Markus Daft M.D.   On: 06/11/2013 16:25   Ir US Guide Vasc Access Left  06/01/2013   CLINICAL DATA:  Massive intraperitoneal hemorrhage secondary to active extravasation from a a liver lesion. The patient went into hypovolemic shock and cardiac arrest and was resuscitated. Emergency consent for hepatic embolization is requested. I  am in agreement with the critical care physician.  EXAM: SELECTIVE VISCERAL ARTERIOGRAPHY; IR ULTRASOUND GUIDANCE VASC ACCESS LEFT; TRANSCATHETER THERAPY EMBOLIZATION; ARTERIOGRAPHY  MEDICATIONS AND MEDICAL HISTORY: Ventilated  FLUOROSCOPY TIME:  18 min and 24 seconds.  PROCEDURE: The procedure, risks, benefits, and alternatives were explained to the patient. Questions regarding the procedure were encouraged and answered. The patient understands and consents to the procedure.  The left groin was prepped with Betadine in a sterile fashion, and a sterile drape was applied covering the operative field. A sterile gown and sterile gloves were used for the procedure.  Under sonographic guidance, a micropuncture needle was inserted into the left common femoral artery and removed over a 018 wire which was up sized to a Bentson. A 5 French sheath was inserted. A cobra 2 catheter was advanced over the wire to the upper aorta. The celiac was engaged. Cobra was advanced over the wire into the common hepatic artery. Angiography was performed. This demonstrated active extravasation in the posterior segment of the right lobe of the liver.  The cobra catheter was advanced beyond the gastroduodenal artery origin into the proper hepatic artery and additional angiographic imaging was performed.  After determining the suspect vessel, a micro catheter was advanced through the Cobra catheter and over an 018 glidewire into a sub selective branch in the right lobe of the liver. Gel-Foam slurry was utilized for embolization. A single 2 x 5 mm coil was deployed. Follow-up angiogram through the micro catheter demonstrates a wedge-shaped hypoperfused segment in the right lobe corresponding to the area of active extravasation.  The micro catheter was then utilized to select the other 2 large branches to the right lobe can repeat angiography confirmed no other source of active extravasation.  The cobra catheter was removed. The left groin 5  French sheath was flushed and secured in place. INR performed this morning was determined be 2.7. Angiography of the left common femoral artery in preparation for EXOSEAL was performed.  COMPLICATIONS: None  FINDINGS: Angiography through the common hepatic artery delineates anatomy.  Subsequent imaging of the proper hepatic artery demonstrates active extravasation of contrast within the right lobe of the liver corresponding to the abnormality noted on CT.  Subsequent imaging demonstrates both Gel-Foam and coil embolization of the suspect vessel. Repeat angiography demonstrates resolution of the active extravasation.  Subsequent injection of other 3rd order vessels demonstrates no other source of active extravasation.  IMPRESSION: Diagnostic hepatic arterial angiography confirms active extravasation of contrast within the right lobe of the liver. The source vessel was selectively embolize utilizing Gel-Foam slurry and a single platinum coil. This was successful in eliminating active extravasation of bleeding from the liver parenchyma.  : CONTRAST:  100 cc Omnipaque 300   Electronically Signed   By: Maryclare Bean M.D.   On: 06/01/2013 12:52   Dg  Chest Port 1 View  06/04/2013   CLINICAL DATA:  Intubated, ventilated patient.  EXAM: PORTABLE CHEST - 1 VIEW  COMPARISON:  June 03, 2013.  FINDINGS: The lung volumes remain low. The interstitial markings are more prominent today. Confluent density in the region of the minor fissure and inferior to this has progressed. The hemidiaphragms are less well demonstrated today. The cardiopericardial silhouette remains top normal in size. There is soft tissue fullness in the AP window region that is more conspicuous today and suggests atelectasis.  The endotracheal tube tip lies approximately 1.5 cm above the crotch of the carina. The esophagogastric tube tip and proximal port lie in the region of the gastric cardia. The right internal jugular Cordis sheath appears unchanged with  its tip at the level of the proximal SVC.  IMPRESSION: 1. There is been an interval increase in the density of the pulmonary interstitium consistent with interstitial edema. There may be atelectasis adjacent to the inferior aspect of the minor fissure on the right. Pleural fluid layering posteriorly likely partially obscures the hemidiaphragms. 2. The cardiac silhouette is essentially unchanged but the pulmonary vascularity is less distinct. 3. The endotracheal tube tip lies approximately 15 mm above the crotch of the carina. Withdrawal by approximately 2 cm is recommended to a avoid accidental right mainstem bronchus intubation with patient movement.   Electronically Signed   By: David  Martinique   On: 06/04/2013 07:30   Dg Chest Port 1 View  06/03/2013   CLINICAL DATA:  Evaluate ET tube position.  EXAM: PORTABLE CHEST - 1 VIEW  COMPARISON:  Chest radiograph 06/02/2013.  FINDINGS: ET tube terminates 2.5 cm superior to the carina. NG tube courses inferior to the diaphragm. Right IJ central venous catheter tip projects over the superior vena cava. Stable cardiac and mediastinal contours. Low lung volumes. Bibasilar linear opacities. No pleural effusion or pneumothorax. Regional skeleton is unremarkable.  IMPRESSION: 1. ET tube terminates 2.5 cm superior to the carina. 2. Low lung volumes with bibasilar atelectasis.   Electronically Signed   By: Lovey Newcomer M.D.   On: 06/03/2013 07:32   Dg Chest Port 1 View  06/02/2013   CLINICAL DATA:  Intubated.  EXAM: PORTABLE CHEST - 1 VIEW  COMPARISON:  06/01/2013  FINDINGS: Endotracheal tube remains in stable position. Low lung volumes with bibasilar atelectasis. Heart is normal size. No visible significant effusions.  IMPRESSION: Low lung volumes, bibasilar atelectasis.   Electronically Signed   By: Rolm Baptise M.D.   On: 06/02/2013 07:32   Dg Chest Port 1 View  06/01/2013   CLINICAL DATA:  Evaluate endotracheal tube placement and central line placement.  EXAM:  PORTABLE CHEST - 1 VIEW  COMPARISON:  05/30/2013.  FINDINGS: Defibrillator pads overlie the chest. Endotracheal tube is present with the tip 3 cm from the carina. Right IJ vascular sheath is present. There is no pneumothorax. Left basilar atelectasis. Low volumes accentuate the size of the cardiopericardial silhouette and produce tortuosity of the thoracic aorta. Enteric contrast is present in the left upper quadrant.  IMPRESSION: 1. Endotracheal tube and uncomplicated right IJ vascular sheath. Endotracheal tube tip 3 cm from the carina. 2. Left basilar atelectasis.   Electronically Signed   By: Dereck Ligas M.D.   On: 06/01/2013 09:43   [place] [facility] [employer]  06/01/2013   CLINICAL DATA:  Massive intraperitoneal hemorrhage secondary to active extravasation from a a liver lesion. The patient went into hypovolemic  shock and cardiac arrest and was resuscitated. Emergency consent for hepatic embolization is requested. I am in agreement with the critical care physician.  EXAM: SELECTIVE VISCERAL ARTERIOGRAPHY; IR ULTRASOUND GUIDANCE VASC ACCESS LEFT; TRANSCATHETER THERAPY EMBOLIZATION; ARTERIOGRAPHY  MEDICATIONS AND MEDICAL HISTORY: Ventilated  FLUOROSCOPY TIME:  18 min and 24 seconds.  PROCEDURE: The procedure, risks, benefits, and alternatives were explained to the patient. Questions regarding the procedure were encouraged and answered. The patient understands and consents to the procedure.  The left groin was prepped with Betadine in a sterile fashion, and a sterile drape was applied covering the operative field. A sterile gown and sterile gloves were used for the procedure.  Under sonographic guidance, a micropuncture needle was inserted into the left common femoral artery and removed over a 018 wire which was up sized to a Bentson. A 5 French sheath was inserted. A cobra 2 catheter was advanced over the wire to the upper aorta. The celiac was engaged. Cobra was advanced  over the wire into the common hepatic artery. Angiography was performed. This demonstrated active extravasation in the posterior segment of the right lobe of the liver.  The cobra catheter was advanced beyond the gastroduodenal artery origin into the proper hepatic artery and additional angiographic imaging was performed.  After determining the suspect vessel, a micro catheter was advanced through the Cobra catheter and over an 018 glidewire into a sub selective branch in the right lobe of the liver. Gel-Foam slurry was utilized for embolization. A single 2 x 5 mm coil was deployed. Follow-up angiogram through the micro catheter demonstrates a wedge-shaped hypoperfused segment in the right lobe corresponding to the area of active extravasation.  The micro catheter was then utilized to select the other 2 large branches to the right lobe can repeat angiography confirmed no other source of active extravasation.  The cobra catheter was removed. The left groin 5 French sheath was flushed and secured in place. INR performed this morning was determined be 2.7. Angiography of the left common femoral artery in preparation for EXOSEAL was performed.  COMPLICATIONS: None  FINDINGS: Angiography through the common hepatic artery delineates anatomy.  Subsequent imaging of the proper hepatic artery demonstrates active extravasation of contrast within the right lobe of the liver corresponding to the abnormality noted on CT.  Subsequent imaging demonstrates both Gel-Foam and coil embolization of the suspect vessel. Repeat angiography demonstrates resolution of the active extravasation.  Subsequent injection of other 3rd order vessels demonstrates no other source of active extravasation.  IMPRESSION: Diagnostic hepatic arterial angiography confirms active extravasation of contrast within the right lobe of the liver. The source vessel was selectively embolize utilizing Gel-Foam slurry and a single platinum coil. This was successful in  eliminating active extravasation of bleeding from the liver parenchyma.  : CONTRAST:  100 cc Omnipaque 300   Electronically Signed   By: Maryclare Bean M.D.   On: 06/01/2013 12:52    Admission HPI:  Mr. Raymar Joiner is a 58 yo man with h/o cholelithiasis who presented to [facility] ED with complaints of "sore", intermittent, worsening, non-radiating RUQ abdominal pain x 24h. Pain was 10/10 at worse, but 7/10 currently. Symptoms began yesterday morning while cleaning windows, worsened by palpation and alleviated by 27m of morphine given in the ED. He tried naproxen x2 at home with some relief but return on symptoms a few hours after each dose. His las meal was breakfast yesterday morning which he tolerated, but later that afternoon, he had a beer, after  which had 2 episodes of NB/NB emesis. He had similar symptoms earlier this year and was found to have gallstones, but was unable to follow up due to financial constraints. He denies chest pain, fever, hemetemesis, BRBPR, and melena. He admits to frequent EtOH use. He has had night sweats for the last 3 nights, waking up with a wet shirt. No weight change, reports weight to be stable ~120 lbs. Last BM was this AM.  Physical Exam:  Blood pressure 147/94, pulse 80, temperature 97.4 F (36.3 C), temperature source Oral, resp. rate 21, height _0  (1.676 m), weight 46.8 kg (103 lb 2.8 oz), SpO2 100.00%.  General: resting in bed, thin  HEENT: PERRL, EOMI, +scleral icterus, +conjunctival pallor, MMM  Cardiac: RRR, no rubs, murmurs or gallops  Pulm: clear to auscultation bilaterally, moving normal volumes of air  Abd: soft, mildly tender with voluntary guarding of RUQ and epigastric region, mildly distended, BS normoactive  Ext: warm and well perfused, no pedal edema  Neuro: alert and oriented X3, cranial nerves II-XII grossly intact   Hospital Course by problem list:  1. 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 Primary malignancy unknown. Mr. Addo has a history of hepatitis C and his AFP was significantly elevated at 670.9. Ca 19-9 = 96.2 (also high). [place] could be the primary cancer; however, CT appearance looked atypical. Esophageal primary was suspected, but then ruled out with EGD. Patient did want to try and pursue a tissue diagnosis initially for prognostic reasons, but understandably did *not* want to pursue a liver biopsy 2/2 risks given near-fatal perihepatic bleed (see #8 below). We therefore performed a diagnostic paracentesis on 12/30. 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 Unfortunately, the sample was not sufficient for a tissue diagnosis via cytology. Pathology report shows scattered atypical cells not quantitatively diagnostic of malignancy, and too scant of tissue present for additional studies. Dr. Marin Olp of medical oncology saw the patient in consultation and predicted survival <2 months given pre-albumin of 7.7 and extent of disease, no matter what the primary malignancy. Palliative Care, who had been following the patient since his time in the ICU, met with the patient and his family again on 1/2. Mr. Mcglasson decided he wanted to be DNR/DNI and pursue a comfort care approach with a focus on quality of life. We started Decadron 88m q12h to help with his comfort, decrease tumor edema, and increase energy level. He felt significantly better after starting this. Patient was discharged to residential hospice in stable condition.  2. SBP - Paracentesis fluid cell count showed 5560 WBC, 46% neutrophils, consistent with SBP. Culture with NGTD. Patient was already on ceftriaxone for pyelonephritis at the time of the tap. He completed a 5 day course of Ceftriaxone 2g IV q24hr for SBP and a 3 day course of albumin 25% IV. There is no  need for Bactrim prophylaxis going forward given comfort care only goals.  3. Hoarseness 2/2 posterior glottic and subgottic inflammation - Patient suffered from hoarseness and cough since being extubated on 12/23. Fibro scopic evaluation by ENT (Dr. BRedmond Baseman showed marked inflammatory, granulation changes of the posterior glottis and subgottis due to intubation, impeding normal vocal fold mobility, and partially blocking the airway. Dr. BRedmond Basemanfelt this would improve with time. We provided Protonix 467mpo BID and  steroids (Decadron 18m q12h). We initially started humidified full face mask oxygen, but this was discontinued for patient comfort and replaced with nasal cannula O2 _0  with humidity and NaCl 0.9% nebs q4 hours scheduled. He was initially placed on a conservative dysphagia 1 diet, but this was liberalized to a regular diet for comfort feeding after goals of care changed.  If breathing were to become difficult, steroids and epinephrine nebs could be tried; tracheostomy would possibly be required, however patient is now DNI.   4. Weight gain, resolving - Patient was 103 lbs on admission, rising to 151 lbs on 12/22 s/p fluid and blood product resuscitation in the ICU. Scrotal edema, abdominal ascites, and 2+ pitting lower extremity edema noted at that time. Weight gain likely secondary to fluid overload exacerbated by malignant ascites. With the addition of Lasix 448mIV daily and Spironolactone 10068mo daily, his volume status improved clinically over time. Scrotal edema and abdominal ascites mostly resolved by discharge. Weight down trended as below. He is s/p paracentesis on 12/30.   Filed Weights   06/12/13 0425 06/13/13 1100 06/14/13 1500  Weight: 135 lb 6.4 oz (61.417 kg) 130 lb 1.1 oz (59 kg) 124 lb 1.9 oz (56.3 kg)    5. Complicated UTI vs. pyelonephritis - Patient noted to have flank pain, mild dysuria, and gross hematuria on 12/18. UA was positive for nitrites and LE, many bacteria, few  squams. Presentation suggestive of UTI vs. Pyelonephritis, likely associated with having a Foley catheter while he was in the ICU. Urine culture grew >100K E. Coli, pan-sensitive. Symptoms resolved s/p a 5 day course of Ceftriaxone 2g IV q24hr. Blood culture > NGTD.  6. Hypernatremia - Likely 2/2 Lasix. No further blood draws per patient.  Sodium   Date  Value  Range  Status   06/14/2013  148*  137 - 147 mEq/L  Final   Please note change in reference range.   06/13/2013  149*  137 - 147 mEq/L  Final   Please note change in reference range.   06/13/2013  147  137 - 147 mEq/L  Final   Please note change in reference range.   06/12/2013  147  137 - 147 mEq/L  Final   Please note change in reference range.   06/12/2013  151*  137 - 147 mEq/L  Final   Please note change in reference range.    7. Leukocytosis - Trending down at time of discharge. Patient had a UTI/pyelonephritis, SBP, and was on systemic steroids. He was afebrile, AVSS at discharge. No further blood draws per patient.  WBC   Date  Value  Range  Status   06/14/2013  15.0*  4.0 - 10.5 K/uL  Final   06/13/2013  17.7*  4.0 - 10.5 K/uL  Final   06/12/2013  19.1*  4.0 - 10.5 K/uL  Final   06/11/2013  17.1*  4.0 - 10.5 K/uL  Final   06/10/2013  20.0*  4.0 - 10.5 K/uL  Final    8. Shock due to perihepatic hemorrhage, resolved - Patient is s/p VT arrest/Code Blue on 12/20 secondary to hemorrhagic shock from spontaneous perihepatic hemorrhage from one of his subcapsular liver lesions. IR performed emergent hepatic/mesentericGel-Foam and coil embolization at that time. He recovered from this remarkably well and was transferred back to the Internal Medicine service on 12/25. Vital signs currently stable. Hgb stable but with slight recent down trend as below, perhaps due to hemorrhoidal bleeding. On 12/27 PT was 14.0,  INR 1.10. No further blood draws per patient.  Hemoglobin   Date  Value  Range  Status   06/14/2013  10.5*  13.0 - 17.0 g/dL  Final     06/13/2013  10.8*  13.0 - 17.0 g/dL  Final   06/12/2013  11.5*  13.0 - 17.0 g/dL  Final   06/11/2013  12.1*  13.0 - 17.0 g/dL  Final   06/10/2013  12.0*  13.0 - 17.0 g/dL  Final    9. Rectal bleeding - On 12/31 nurse noted small amount of noticeable bright red blood mix with soft but formed brown stool. Likely 2/2 hemorrhoidal bleed/portal hypertension due to appearance, in which case banding is contraindicated. Hemoglobin stable as above. No further blood draws per patient.  10. Hypokalemia - Likely 2/2 Lasix administration. We provided K-dur 56mq BID with resolution. No further blood draws per patient.  Potassium   Date  Value  Range  Status   06/14/2013  4.0  3.7 - 5.3 mEq/L  Final   Please note change in reference range.   06/13/2013  4.3  3.7 - 5.3 mEq/L  Final   Please note change in reference range.   06/13/2013  3.7  3.7 - 5.3 mEq/L  Final   Please note change in reference range.   06/12/2013  3.7  3.7 - 5.3 mEq/L  Final   Please note change in reference range.   06/12/2013  3.4*  3.7 - 5.3 mEq/L  Final   Please note change in reference range.    11. Severe malnutrition in the context of chronic illness - Nutritional services was consulted and recommended Ensure complete PO BID, which was ordered. Prealbumin 7.7 (low).   12. Financial issues - Patient is uninsured and does not have a PCP. He rented a room from a friend prior to admission. Financial counselor began a Medicaid application for him prior to discharge.  13. Code status - DNR/DNI   Discharge Vitals:   BP 131/80  Pulse 102  Temp(Src) 98.3 F (36.8 C) (Oral)  Resp 18  Ht _0  (1.676 m)  Wt 124 lb 1.9 oz (56.3 kg)  BMI 20.04 kg/m2  SpO2 100%  Discharge Labs:  No results found for this or any previous visit (from the past 24 hour(s)). BMET    Component Value Date/Time   NA 148* 06/14/2013 0605   K 4.0 06/14/2013 0605   CL 111 06/14/2013 0605   CO2 20 06/14/2013 0605   GLUCOSE 83 06/14/2013 0605   BUN 18 06/14/2013  0605   CREATININE 0.83 06/14/2013 0605   CALCIUM 9.1 06/14/2013 0605   GFRNONAA >90 06/14/2013 0605   GFRAA >90 06/14/2013 0605    CBC    Component Value Date/Time   WBC 15.0* 06/14/2013 0605   RBC 3.42* 06/14/2013 0605   RBC 1.98* 05/30/2013 1350   HGB 10.5* 06/14/2013 0605   HCT 31.1* 06/14/2013 0605   PLT 295 06/14/2013 0605   MCV 90.9 06/14/2013 0605   MCH 30.7 06/14/2013 0605   MCHC 33.8 06/14/2013 0605   RDW 19.5* 06/14/2013 0605   LYMPHSABS 0.9 06/03/2013 0312   MONOABS 1.2* 06/03/2013 0312   EOSABS 0.0 06/03/2013 0312   BASOSABS 0.0 06/03/2013 04210   Signed: SLesly Dukes MD 06/17/2013, 9:02 AM   Time Spent on Discharge: 50 minutes Services Ordered on Discharge: None Equipment Ordered on Discharge: None

## 2013-06-15 DIAGNOSIS — K625 Hemorrhage of anus and rectum: Secondary | ICD-10-CM

## 2013-06-15 DIAGNOSIS — E41 Nutritional marasmus: Secondary | ICD-10-CM

## 2013-06-15 DIAGNOSIS — D72829 Elevated white blood cell count, unspecified: Secondary | ICD-10-CM

## 2013-06-15 DIAGNOSIS — E876 Hypokalemia: Secondary | ICD-10-CM

## 2013-06-15 DIAGNOSIS — N12 Tubulo-interstitial nephritis, not specified as acute or chronic: Secondary | ICD-10-CM

## 2013-06-15 DIAGNOSIS — E870 Hyperosmolality and hypernatremia: Secondary | ICD-10-CM

## 2013-06-15 LAB — BODY FLUID CULTURE: Culture: NO GROWTH

## 2013-06-15 MED ORDER — ALPRAZOLAM 0.5 MG PO TABS: 0.5000 mg | ORAL_TABLET | ORAL | Status: DC | PRN

## 2013-06-15 MED ORDER — DEXAMETHASONE 2 MG PO TABS
2.0000 mg | ORAL_TABLET | Freq: Every day | ORAL | Status: DC
  Administered 2013-06-16 – 2013-06-17 (×2): 2 mg via ORAL
  Filled 2013-06-15 (×2): qty 1

## 2013-06-15 MED ORDER — OXYCODONE HCL 5 MG PO TABS
5.0000 mg | ORAL_TABLET | ORAL | Status: DC | PRN
  Administered 2013-06-16 – 2013-06-17 (×2): 5 mg via ORAL
  Filled 2013-06-15 (×2): qty 1

## 2013-06-15 MED ORDER — FUROSEMIDE 40 MG PO TABS
40.0000 mg | ORAL_TABLET | Freq: Every day | ORAL | Status: DC
  Administered 2013-06-16 – 2013-06-17 (×2): 40 mg via ORAL
  Filled 2013-06-15 (×2): qty 1

## 2013-06-15 MED ORDER — DOCUSATE SODIUM 100 MG PO CAPS: 100.0000 mg | ORAL_CAPSULE | Freq: Two times a day (BID) | ORAL | Status: DC | PRN

## 2013-06-15 MED ORDER — SORBITOL 70 % SOLN
30.0000 mL | Freq: Every day | Status: DC
  Administered 2013-06-15 – 2013-06-16 (×2): 30 mL via ORAL
  Filled 2013-06-15 (×4): qty 30

## 2013-06-15 MED ORDER — DM-GUAIFENESIN ER 30-600 MG PO TB12
1.0000 | ORAL_TABLET | Freq: Two times a day (BID) | ORAL | Status: DC | PRN
  Filled 2013-06-15: qty 1

## 2013-06-15 NOTE — Progress Notes (Signed)
Clinical Education officer, museum ([place]) received call from Erling Conte at [facility] (residential hospice in [place]) stating that patient can be transferred to the hospice home on Monday 06/17/12. CSW made patient's sister Nonie Hoyer [phone] aware of above and told her that [facility] will be in contact with the family on Sunday to do paper work. Sister verbalized her understanding. Weekday CSW will assist with D/C to [facility].   Blima Rich, [place] Weekend CSW [phone]

## 2013-06-15 NOTE — Progress Notes (Signed)
Clinical Education officer, museum (CSW) received call from MD inquiring about residential hospice placement. Per [facility] has no beds available today. CSW followed up with [facility] and sent a referral to the referral team for review. CSW waiting to hear back from [facility].   Blima Rich, [place] Weekend CSW [phone]

## 2013-06-15 NOTE — Progress Notes (Signed)
Subjective:   Patient seen and examined at the bedside this morning. He is sitting in his recliner and appears comfortable. His hoarseness is getting better and he is speaking in full sentences. He says his shortness of breath is improving, but he still has a productive cough. He shows [place] a cup of frothy clear sputum he brought up overnight. He is not interested in [place] ordering suction as he thinks it made it worse last time. He denies chest pain, abdominal pain. His leg edema is much improved. He tells [place] he would like to go home and rest while the hospice facility placement is sorted out, but he understands that it's easier and safer for [place] to discharge him to hospice from the hospital.  Objective:   Vital signs in last 24 hours: Filed Vitals:   06/14/13 1900 06/14/13 1952 06/14/13 2042 06/15/13 0529  BP: 164/91  157/82 139/66  Pulse: 119  88 83  Temp:  98.7 F (37.1 C) 98.4 F (36.9 C) 98.3 F (36.8 C)  TempSrc:  Oral Oral Oral  Resp: 24  16 16   Height:      Weight:      SpO2: 97%  100% 100%   Weight change: -5 lb 15.2 oz (-2.7 kg) No intake or output data in the 24 hours ending 06/15/13 0956  Physical Exam: Constitutional: No distress.  Head: Normocephalic and atraumatic Eyes: PERRL, EOMI, +scleral icterus  Neck: Supple, Trachea midline. Hoarse voice improving. Cardiovascular: Regular rate and rhythm, S1, S2 present, no MRG, DP 2+ b/l; improvement in pitting edema up to the thighs b/l, improvement in genital/scrotal edema, still with 2+ pitting edema to upper shin Pulmonary/Chest: normal respiratory effort, no inspiratory wheezes in upper airway, improvement in soft crackles at right lung base, SpO2 100% on RA; no gynecomastia Abdominal: Soft. +BS, no tenderness to palpation, mildly distended Musculoskeletal: No joint deformities, erythema, or stiffness Neurological: A&O x3, cranial nerve II-XII are grossly intact, moving all extremities Skin: Warm, dry and intact. No  rash Psychiatric: Normal mood and affect.   Lab Results:  BMP:  Recent Labs Lab 06/13/13 0410 06/13/13 1545 06/14/13 0605  NA 147 149* 148*  K 3.7 4.3 4.0  CL 112 111 111  CO2 20 27 20   GLUCOSE 82 109* 83  BUN 21 19 18   CREATININE 0.89 0.97 0.83  CALCIUM 9.0 9.3 9.1  MG 2.2  --  2.1    CBC:  Recent Labs Lab 06/13/13 0410 06/14/13 0605  WBC 17.7* 15.0*  HGB 10.8* 10.5*  HCT 32.5* 31.1*  MCV 91.3 90.9  PLT 259 295    Coagulation:  Recent Labs Lab 06/09/13 0605  LABPROT 14.0  INR 1.10    CBG:            Recent Labs Lab 06/13/13 0854 06/13/13 1230 06/13/13 1649 06/13/13 2130 06/14/13 0747 06/14/13 1205  GLUCAP 77 100* 98 101* 72 103*          LFTs:  Recent Labs Lab 06/12/13 1100  AST 218*  ALT 78*  ALKPHOS 373*  BILITOT 5.2*  PROT 7.0  ALBUMIN 2.1*    Cardiac Enzymes: No results found for this basename: CKTOTAL, CKMB, CKMBINDEX, TROPONINI,  in the last 168 hours Lab Results  Component Value Date   CKTOTAL 232 05/31/2013   CKMB 3.9 05/31/2013   TROPONINI <0.30 06/01/2013    EKG:  Date/Time:  Thursday May 30 2013 09:46:47 EST Ventricular Rate:  82 PR Interval:  138  QRS Duration: 72 QT Interval:  388 QTC Calculation: 453 R Axis:   68 Text Interpretation:  Sinus rhythm Normal ECG No significant change since last tracing Confirmed by Christy Gentles  MD, DONALD [phone]) on 05/30/2013 10:02:53 AM   Micro Results: Recent Results (from the past 240 hour(s))  URINE CULTURE     Status: None   Collection Time    06/09/13  4:43 PM      Result Value Range Status   Specimen Description URINE, RANDOM   Final   Special Requests NONE   Final   Culture  Setup Time     Final   Value: 06/09/2013 22:56     Performed at [facility]     Final   Value: >=100,000 COLONIES/ML     Performed at [facility]   Culture     Final   Value: ESCHERICHIA COLI     Performed at [facility]   Report Status  06/11/2013 FINAL   Final   Organism ID, Bacteria ESCHERICHIA COLI   Final  CULTURE, BLOOD (ROUTINE X 2)     Status: None   Collection Time    06/09/13  9:45 PM      Result Value Range Status   Specimen Description BLOOD RIGHT ARM   Final   Special Requests BOTTLES DRAWN AEROBIC ONLY 10CC   Final   Culture  Setup Time     Final   Value: 06/10/2013 11:16     Performed at [facility]   Culture     Final   Value:        BLOOD CULTURE RECEIVED NO GROWTH TO DATE CULTURE WILL BE HELD FOR 5 DAYS BEFORE ISSUING A FINAL NEGATIVE REPORT     Performed at [facility]   Report Status PENDING   Incomplete  CULTURE, BLOOD (ROUTINE X 2)     Status: None   Collection Time    06/09/13  9:55 PM      Result Value Range Status   Specimen Description BLOOD LEFT HAND   Final   Special Requests BOTTLES DRAWN AEROBIC ONLY 10CC   Final   Culture  Setup Time     Final   Value: 06/10/2013 11:16     Performed at [facility]   Culture     Final   Value:        BLOOD CULTURE RECEIVED NO GROWTH TO DATE CULTURE WILL BE HELD FOR 5 DAYS BEFORE ISSUING A FINAL NEGATIVE REPORT     Performed at [facility]   Report Status PENDING   Incomplete  BODY FLUID CULTURE     Status: None   Collection Time    06/11/13  2:16 PM      Result Value Range Status   Specimen Description ASCITIC ABDOMEN FLUID   Final   Special Requests 60ML FLUID   Final   Gram Stain     Final   Value: RARE WBC PRESENT,BOTH PMN AND MONONUCLEAR     NO ORGANISMS SEEN     Performed at [facility]   Culture     Final   Value: NO GROWTH 3 DAYS     Performed at [facility]   Report Status PENDING   Incomplete  MRSA PCR SCREENING     Status: None   Collection Time    06/12/13  7:00 PM      Result Value Range Status  MRSA by PCR NEGATIVE  NEGATIVE Final   Comment:            The GeneXpert MRSA Assay (FDA     approved for NASAL specimens     only), is one component of a     comprehensive MRSA  colonization     surveillance program. It is not     intended to diagnose MRSA     infection nor to guide or     monitor treatment for     MRSA infections.    Medications:  Scheduled Meds: . dexamethasone  2 mg Oral Q12H  . feeding supplement (ENSURE COMPLETE)  237 mL Oral BID BM  . folic acid  1 mg Oral Daily  . furosemide  40 mg Intravenous Daily  . magic mouthwash w/lidocaine  15 mL Oral QID  . pantoprazole  40 mg Oral BID  . potassium chloride SA  40 mEq Oral Daily  . sodium chloride  3 mL Intravenous Q12H  . spironolactone  100 mg Oral Daily  . thiamine  100 mg Oral Daily   Continuous Infusions:  PRN Meds:.sodium chloride, levalbuterol, sodium chloride  Antibiotics: Anti-infectives   Start     Dose/Rate Route Frequency Ordered Stop   06/09/13 2000  cefTRIAXone (ROCEPHIN) 2 g in dextrose 5 % 50 mL IVPB  Status:  Discontinued     2 g 100 mL/hr over 30 Minutes Intravenous Every 24 hours 06/09/13 1856 06/15/13 0731     Antibiotics Given (last 72 hours)   Date/Time Action Medication Dose Rate   06/12/13 2100 Given   cefTRIAXone (ROCEPHIN) 2 g in dextrose 5 % 50 mL IVPB 2 g 100 mL/hr   06/13/13 2054 Given   cefTRIAXone (ROCEPHIN) 2 g in dextrose 5 % 50 mL IVPB 2 g 100 mL/hr   06/14/13 2138 Given   cefTRIAXone (ROCEPHIN) 2 g in dextrose 5 % 50 mL IVPB 2 g 100 mL/hr       Day of Hospitalization:  16 Consults: Treatment Team:  Palliative Beola Cord, MD  Assessment/Plan:   #Multiple hepatic masses with omental caking, 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 S/p family meeting with palliative care on 1/2. Patient would now like to be DNR/DNI and pursue a comfort care approach. Social work Mel Almond @ [phone]) is on board to pursue a residential hospice placement as soon as possible, hopefully this weekend. Patient would like to stay in [facility]. [facility] has no beds, so a second bed request was sent to [facility] this morning. - Appreciate very much palliative care recs - Full comfort care, focus on quality of life - No additional interventions or procedures, minimize blood draws, discontinue CBGs - Transfer to regular floor - Discontinue cardiac monitoring - Liberalizing to a regular diet for comfort feeding as tolerated - Starting Decadron 2mg  q12h help with his comfort, decrease tumor edema and increase energy level - Magic mouthwash QID - Adding Mucinex DM BID prn for cough  #SBP - Paracentesis fluid cell count shows 5560 WBC, 46% neutrophils, consistent with SBP. Culture with NGTD, but patient was already on ceftriaxone 2g IV daily for pyelonephritis at the time of the tap. Abdominal pain is gone and all vital signs are stable. He is afebrile. - He has completed a 5 day course of Ceftriaxone 2g IV q24hr for SBP - He has completed a 3 day course  of albumin 25% IV - No need for Bactrim prophylaxis going forward given comfort care only  #Hoarseness 2/2 posterior glottic and subgottic inflammation - Patient has suffered from hoarseness and cough since being extubated on 12/23. ENT evaluation showed marked inflammatory, granulation changes of the posterior glottis and subgottis due to intubation, impeding normal vocal fold mobility, and partially blocking the airway. - Appreciate ENT recs - Discontinue continuous pulse oximetry given new goals of care - Liberalizing to a regular diet for comfort feeding as tolerated - Protonix 40mg  po BID - Discontinue humidified full face mask oxygen for patient comfort, replacing with nasal cannula O2 @5L  with humidity and NaCl 0.9% nebs q4 hours scheduled - Xopenex nebs q6h prn - No need for re-scope next week given change in goals of care  #Weight gain, resolving - Likely secondary to fluid overload and malignant ascites. Scrotal edema and abdominal ascites have mostly resolved. He still has  pitting edema in the lower extremities. Weight is down trending with diuretics (see below) and volume status is improving clinically. He is s/p paracentesis on 12/30. Weight on admission was ~105lbs. - Daily weights  - Lasix 40mg  IV daily, will consider de-escalating over the next few days - Spironolactone 100mg  po daily - Continue scrotal support  Filed Weights   06/12/13 0425 06/13/13 1100 06/14/13 1500  Weight: 135 lb 6.4 oz (61.417 kg) 130 lb 1.1 oz (59 kg) 124 lb 1.9 oz (99991111 kg)    #Complicated UTI vs. pyelonephritis - Patient noted to have flank pain, mild dysuria, and gross hematuria on 12/18. UA was positive for nitrites and LE, many bacteria, few squams. Presentation suggestive of UTI vs. Pyelonephritis, likely associated with having a Foley catheter while he was in the ICU. Urine culture grew >100K E. Coli, pan-sensitive. He is now asymptomatic s/p a 5 day course of Ceftriaxone 2g IV q24hr. - Follow up blood culture > NGTD  #Hypernatremia - Likely 2/2 Lasix. - No further blood draws per patient  Sodium  Date Value Range Status  06/14/2013 148* 137 - 147 mEq/L Final     Please note change in reference range.  06/13/2013 149* 137 - 147 mEq/L Final     Please note change in reference range.  06/13/2013 147  137 - 147 mEq/L Final     Please note change in reference range.  06/12/2013 147  137 - 147 mEq/L Final     Please note change in reference range.  06/12/2013 151* 137 - 147 mEq/L Final     Please note change in reference range.    #Leukocytosis - Trending down. Patient has a UTI/pyelonephritis, SBP, and has been on systemic steroids, which we recently tapered off. He is afebrile, AVSS. - No further blood draws per patient  WBC  Date Value Range Status  06/14/2013 15.0* 4.0 - 10.5 K/uL Final  06/13/2013 17.7* 4.0 - 10.5 K/uL Final  06/12/2013 19.1* 4.0 - 10.5 K/uL Final  06/11/2013 17.1* 4.0 - 10.5 K/uL Final  06/10/2013 20.0* 4.0 - 10.5 K/uL Final    #Shock due to  perihepatic hemorrhage, resolved - Patient is s/p VT arrest/Code Blue on 12/20 secondary to hemorrhagic shock from perihepatic hemorrhage from subcapsular lesion. IR performed emergent hepatic/mesentericGel-Foam and coil embolization. Vital signs are currently stable. Hgb stable but with slight recent down trend as below, perhaps due to hemorrhoidal bleeding. On 12/27 PT was 14.0, INR 1.10. - No further blood draws per patient  Hemoglobin  Date Value Range Status  06/14/2013  10.5* 13.0 - 17.0 g/dL Final  06/13/2013 10.8* 13.0 - 17.0 g/dL Final  06/12/2013 11.5* 13.0 - 17.0 g/dL Final  06/11/2013 12.1* 13.0 - 17.0 g/dL Final  06/10/2013 12.0* 13.0 - 17.0 g/dL Final   #Rectal bleeding - On 12/31 nurse noted small amount of noticeable bright red blood mix with soft but formed brown stool. Likely 2/2 hemorrhoidal bleed/portal hypertension due to appearance, in which case banding is contraindicated. Hemoglobin stable as above.  - No further blood draws per patient  #Hypokalemia-  Stable as below. - Continue K-dur 27meq BID - No further blood draws per patient  Potassium  Date Value Range Status  06/14/2013 4.0  3.7 - 5.3 mEq/L Final     Please note change in reference range.  06/13/2013 4.3  3.7 - 5.3 mEq/L Final     Please note change in reference range.  06/13/2013 3.7  3.7 - 5.3 mEq/L Final     Please note change in reference range.  06/12/2013 3.7  3.7 - 5.3 mEq/L Final     Please note change in reference range.  06/12/2013 3.4* 3.7 - 5.3 mEq/L Final     Please note change in reference range.    #Adrenal insufficiency? - There was concern for adrenal insufficiency when he was in the unit. 06/01/13 Cortisol = 16.1, which is wnl. We have tapered off his steroids since that time.  #Severe malnutrition in the context of chronic illness - Nutrition consulted and recommended ensure complete po bid which was ordered. Prealbumin 7.7 (low). - Appreciate RD recommendations  #Financial issues -  Patient is uninsured and does not have a PCP. He rents a room from a friend. - Development worker, community following, Medicaid application has been started  #Code status - DNR/DNI   #Dispo- Disposition is deferred at this time.  Anticipated discharge in approximately 1-3 days to a residential hospice facility.    LOS: 16 days   Lesly Dukes, MD PGY-1, Internal Medicine  [phone] 06/15/2013, 9:56 AM

## 2013-06-15 NOTE — Progress Notes (Addendum)
We saw [name] today. He was back up to a regular bed. He looks pretty comfortable.  We have not been able to make a firm diagnosis of his malignancy. However, his performance status is poor (ECOG 3) so he is not a candidate for systemic chemotherapy.  Palliative care has seen him. His CODE STATUS is now DO NOT RESUSCITATE. I fully agree with this.  His prealbumin is only 7.7.  He really would benefit from hospice referral and placed in at a hospice facility. [place] the [place] side [place] town. As such, [facility] would be perfect for him.  I had no clue as to why he would be referred to [facility]. This makes no sense to me and I will not agree to him being sent there if there is a bed.  I spoke to the [facility] staff. They will get [name] on a priority list.  His vital signs looked okay. He is eating a little bit. His abdomen is a little distended. He may have ascites there.  Looks comfortable.  Again, he is ONLY to go to [facility] upon discharge. If there is any question about this please call me. Again, trying to move him to [facility]  is wrong. He only has a few weeks to live. I would not want to make it difficult for his family to have to travel 40-45 minutes when they can just see him [facility] in 5-10 minutes.  I will pray about this.  Pete E.  Isaiah 41:10

## 2013-06-15 NOTE — Progress Notes (Signed)
Palliative Care Team at [facility] Note   SUBJECTIVE: [facility] is OOB this AM, not on any supplemental O2 doing much better-his energy seems better as well likely from the steroid. He took a shower yesterday which made a big difference in how he felt-he is asking if he can get "unhooked" and move around. He is asking to wear pants and his own clothes- He tells me he wants to live with whatever time he has left and move around. He knows and understands the Hospice plan.   OBJECTIVE: Vital Signs: BP 139/66  Pulse 83  Temp(Src) 98.3 F (36.8 C) (Oral)  Resp 16  Ht 5\' 6"  (1.676 m)  Wt 56.3 kg (124 lb 1.9 oz)  BMI 20.04 kg/m2  SpO2 100%   Intake and Output:    Physical Exam: General: Vital signs reviewed and noted.In no acute distress; alert, appropriate and cooperative throughout examination.  Head: Normocephalic, atraumatic.  Lungs:  Scattered wheezes. Upper airway congestion  Heart: RRR. S1 and S2 normal without gallop,  or rubs. (+) murmur  Abdomen:  Mildly tender to palpation.  Extremities: +edema.    No Known Allergies  Medications: Scheduled Meds:  . [START ON 06/16/2013] dexamethasone  2 mg Oral Daily  . feeding supplement (ENSURE COMPLETE)  237 mL Oral BID BM  . folic acid  1 mg Oral Daily  . furosemide  40 mg Oral Daily  . magic mouthwash w/lidocaine  15 mL Oral QID  . pantoprazole  40 mg Oral BID  . potassium chloride SA  40 mEq Oral Daily  . sodium chloride  3 mL Intravenous Q12H  . spironolactone  100 mg Oral Daily  . thiamine  100 mg Oral Daily    Continuous Infusions:    PRN Meds: sodium chloride, ALPRAZolam, dextromethorphan-guaiFENesin, levalbuterol, oxyCODONE, sodium chloride  Stool Softner: added PRN  Palliative Performance Scale: 50 %   Labs: CBC    Component Value Date/Time   WBC 15.0* 06/14/2013 0605   RBC 3.42* 06/14/2013 0605   RBC 1.98* 05/30/2013 1350   HGB 10.5* 06/14/2013 0605   HCT 31.1* 06/14/2013 0605   PLT 295 06/14/2013 0605   MCV 90.9 06/14/2013 0605   MCH 30.7 06/14/2013 0605   MCHC 33.8 06/14/2013 0605   RDW 19.5* 06/14/2013 0605   LYMPHSABS 0.9 06/03/2013 0312   MONOABS 1.2* 06/03/2013 0312   EOSABS 0.0 06/03/2013 0312   BASOSABS 0.0 06/03/2013 0312    CMET     Component Value Date/Time   NA 148* 06/14/2013 0605   K 4.0 06/14/2013 0605   CL 111 06/14/2013 0605   CO2 20 06/14/2013 0605   GLUCOSE 83 06/14/2013 0605   BUN 18 06/14/2013 0605   CREATININE 0.83 06/14/2013 0605   CALCIUM 9.1 06/14/2013 0605   PROT 7.0 06/12/2013 1100   ALBUMIN 2.1* 06/12/2013 1100   AST 218* 06/12/2013 1100   ALT 78* 06/12/2013 1100   ALKPHOS 373* 06/12/2013 1100   BILITOT 5.2* 06/12/2013 1100   GFRNONAA >90 06/14/2013 0605   GFRAA >90 06/14/2013 0605   ASSESSMENT/ PLAN: [facility] looks significantly better today after shifting to a full comfort care approach yesterday. His prognosis however is unchanged he just has a better QOL now than in the SDU. He complains of pain in his abdomen-likely from his malignancy and capsular pain at biopsy site. He is doing well with a regular diet from a dysphagia standpoint-although he has reduced intake. His throat pain responded well to MMW with Lidocaine.  My recommendations for [facility] remain-this would be the best environment for his EOL care- I am not sure what his home environment is like and really didn't address this yesterday during my re-goals because he was so debilitated I did not think it would be an option. He tells me he is ok going to [facility] as he can move around and walk if he is able.He had active substance abuse issues, ETOH and Cocaine, prior to admission. 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    Get him disconnected from his IV-will change him to PO lasix and NSL  Ok for him to shower and wear his pants as requested  Added on daily sorbitol for constipation, and colace prn  Added prn oxycodone for his  abdominal pain  Added prn alprazolam for agitation or anxiety-he is a little more restless than usual this AM  I put in a PT order to assess his mobility and need for assistive devices-ambulation will be an important part of his QOL and I want to encourage safe mobility for whatever time he has left.    25 minutes. Greater than 50%  of this time was spent counseling and coordinating care related to the above assessment and plan.   Acquanetta Chain, DO  06/15/2013, 10:00 AM  Please contact Palliative Medicine Team phone at [phone] for questions and concerns.

## 2013-06-16 DIAGNOSIS — K652 Spontaneous bacterial peritonitis: Secondary | ICD-10-CM

## 2013-06-16 LAB — CULTURE, BLOOD (ROUTINE X 2)
Culture: NO GROWTH
Culture: NO GROWTH

## 2013-06-16 NOTE — Progress Notes (Addendum)
Palliative Medicine Team Progress Note  Appreciate the opportunity to care for this patient. Dr. Marin Olp has arranged for him to get a bed at [facility] on Monday--this is definitely where he needs to be and great news-I had actually spoken to [facility] yesterday AM to let them know [facility] was preferable option and an assessment was not necessary. It is the current practice of social work to fax clinicals to multiple facilities to facilitate residential hospice discharge when there is uncertainty about bed availability-I am happy that it has worked out for [facility] to be at [facility]. His symptoms are stable for now and his goals of care are established. PMT will sign off. Please re-consult if we can be of further assistance.  Lane Hacker, DO Palliative Medicine

## 2013-06-16 NOTE — Evaluation (Signed)
Physical Therapy Evaluation Patient Details Name: Micheal Daniel MRN: [id number] DOB: 1956-02-28 Today's Date: 06/16/2013 Time: 1208-1218 PT Time Calculation (min): 10 min  PT Assessment / Plan / Recommendation History of Present Illness  [name] is a 58 yo man with h/o cholelithiasis who presented to [facility] ED with complaints of "sore", intermittent, worsening, non-radiating RUQ abdominal pain x 24h.  Patient with liver mets - unsure of primary.    Clinical Impression  Patient is independent with all mobility/gait.  Good balance.  No acute PT needs.  Encouraged patient to continue to ambulate in hallway with nursing.  PT will sign off.    PT Assessment  Patent does not need any further PT services    Follow Up Recommendations  No PT follow up;Supervision - Intermittent    Does the patient have the potential to tolerate intense rehabilitation      Barriers to Discharge        Equipment Recommendations  None recommended by PT    Recommendations for Other Services     Frequency      Precautions / Restrictions Precautions Precautions: None Restrictions Weight Bearing Restrictions: No   Pertinent Vitals/Pain       Mobility  Bed Mobility Bed Mobility: Not assessed Transfers Transfers: Sit to Stand;Stand to Sit Sit to Stand: 7: Independent;With upper extremity assist;With armrests;From chair/3-in-1 Stand to Sit: 7: Independent;With upper extremity assist;With armrests;To chair/3-in-1 Details for Transfer Assistance: No cues or assist needed Ambulation/Gait Ambulation/Gait Assistance: 7: Independent Ambulation Distance (Feet): 200 Feet Assistive device: None Ambulation/Gait Assistance Details: Good gait pattern, balance, and speed Gait Pattern: Within Functional Limits Gait velocity: WFL    Exercises     PT Diagnosis:    PT Problem List:   PT Treatment Interventions:       PT Goals(Current goals can be found in the care plan section)    Visit  Information  Last PT Received On: 06/16/13 Assistance Needed: +1 History of Present Illness: [name] is a 59 yo man with h/o cholelithiasis who presented to [facility] ED with complaints of "sore", intermittent, worsening, non-radiating RUQ abdominal pain x 24h.  Patient with liver mets - unsure of primary.         Prior [place] expects to be discharged to:: Hospice/Palliative care Living Arrangements: Other relatives Available Help at Discharge: Family;Available PRN/intermittently Prior Function Level of Independence: Independent Communication Communication: No difficulties    Cognition  Cognition Arousal/Alertness: Awake/alert Behavior During Therapy: WFL for tasks assessed/performed Overall Cognitive Status: Within Functional Limits for tasks assessed    Extremity/Trunk Assessment Upper Extremity Assessment Upper Extremity Assessment: Overall WFL for tasks assessed Lower Extremity Assessment Lower Extremity Assessment: Overall WFL for tasks assessed Cervical / Trunk Assessment Cervical / Trunk Assessment: Normal   Balance Balance Balance Assessed: Yes High Level Balance High Level Balance Activites: Turns;Sudden stops;Head turns High Level Balance Comments: No loss of balance with high level balance activities  End of Session PT - End of Session Activity Tolerance: Patient tolerated treatment well Patient left: in chair;with call bell/phone within reach Nurse Communication: Mobility status (Encouraged ambulation in hallway with nursing/family)  GP     Despina Pole 06/16/2013, 12:57 PM Carita Pian. Sanjuana Kava, [place] Pager [phone]

## 2013-06-16 NOTE — Progress Notes (Signed)
Subjective:   Patient seen and examined at the bedside this morning. He is walking about the room. He feels well on the Decadron and denies abdominal pain, nausea, vomiting, chest pain. He is still a bit short of breath and hoarse but both conditions are improving. Plan is to go to [facility]. He is ready to get out of the hospital.   Objective:   Vital signs in last 24 hours: Filed Vitals:   06/15/13 0529 06/15/13 1400 06/15/13 2156 06/16/13 0511  BP: 139/66 145/67 147/72 137/76  Pulse: 83 74 76 79  Temp: 98.3 F (36.8 C) 98 F (36.7 C) 98.2 F (36.8 C) 98 F (36.7 C)  TempSrc: Oral Oral Oral   Resp: 16 18 17 16   Height:      Weight:      SpO2: 100% 100% 94% 98%   Weight change:   Intake/Output Summary (Last 24 hours) at 06/16/13 0943 Last data filed at 06/15/13 2200  Gross per 24 hour  Intake      3 ml  Output    200 ml  Net   -197 ml    Physical Exam: Constitutional: No distress.  Head: Normocephalic and atraumatic Eyes: PERRL, EOMI, +scleral icterus  Neck: Supple, Trachea midline. Hoarse voice improving. Cardiovascular: Regular rate and rhythm, S1, S2 present, no MRG, DP 2+ b/l; improvement in pitting edema up to the thighs b/l, improvement in genital/scrotal edema, still with 2+ pitting edema to ankle Pulmonary/Chest: normal respiratory effort, no inspiratory wheezes in upper airway, improvement in soft crackles at right lung base; no gynecomastia Abdominal: Soft. +BS, no tenderness to palpation, distended Musculoskeletal: No joint deformities, erythema, or stiffness Neurological: A&O x3, cranial nerve II-XII are grossly intact, moving all extremities Skin: Warm, dry and intact. No rash Psychiatric: Normal mood and affect.   Lab Results:  BMP:  Recent Labs Lab 06/13/13 0410 06/13/13 1545 06/14/13 0605  NA 147 149* 148*  K 3.7 4.3 4.0  CL 112 111 111  CO2 20 27 20   GLUCOSE 82 109* 83  BUN 21 19 18   CREATININE 0.89 0.97 0.83  CALCIUM  9.0 9.3 9.1  MG 2.2  --  2.1    CBC:  Recent Labs Lab 06/13/13 0410 06/14/13 0605  WBC 17.7* 15.0*  HGB 10.8* 10.5*  HCT 32.5* 31.1*  MCV 91.3 90.9  PLT 259 295    Coagulation: No results found for this basename: LABPROT, INR,  in the last 168 hours  CBG:            Recent Labs Lab 06/13/13 0854 06/13/13 1230 06/13/13 1649 06/13/13 2130 06/14/13 0747 06/14/13 1205  GLUCAP 77 100* 98 101* 72 103*          LFTs:  Recent Labs Lab 06/12/13 1100  AST 218*  ALT 78*  ALKPHOS 373*  BILITOT 5.2*  PROT 7.0  ALBUMIN 2.1*    Cardiac Enzymes: No results found for this basename: CKTOTAL, CKMB, CKMBINDEX, TROPONINI,  in the last 168 hours Lab Results  Component Value Date   CKTOTAL 232 05/31/2013   CKMB 3.9 05/31/2013   TROPONINI <0.30 06/01/2013    EKG:  Date/Time:  Thursday May 30 2013 09:46:47 EST Ventricular Rate:  82 PR Interval:  138 QRS Duration: 72 QT Interval:  388 QTC Calculation: 453 R Axis:   68 Text Interpretation:  Sinus rhythm Normal ECG No significant change since last tracing Confirmed by Christy Gentles  MD, [place] [phone]) on 05/30/2013 10:02:53  AM   Micro Results: Recent Results (from the past 240 hour(s))  URINE CULTURE     Status: None   Collection Time    06/09/13  4:43 PM      Result Value Range Status   Specimen Description URINE, RANDOM   Final   Special Requests NONE   Final   Culture  Setup Time     Final   Value: 06/09/2013 22:56     Performed at [facility] Count     Final   Value: >=100,000 COLONIES/ML     Performed at [facility]   Culture     Final   Value: ESCHERICHIA COLI     Performed at [facility]   Report Status 06/11/2013 FINAL   Final   Organism ID, Bacteria ESCHERICHIA COLI   Final  CULTURE, BLOOD (ROUTINE X 2)     Status: None   Collection Time    06/09/13  9:45 PM      Result Value Range Status   Specimen Description BLOOD RIGHT ARM   Final   Special Requests BOTTLES  DRAWN AEROBIC ONLY 10CC   Final   Culture  Setup Time     Final   Value: 06/10/2013 11:16     Performed at [facility]   Culture     Final   Value:        BLOOD CULTURE RECEIVED NO GROWTH TO DATE CULTURE WILL BE HELD FOR 5 DAYS BEFORE ISSUING A FINAL NEGATIVE REPORT     Performed at [facility]   Report Status PENDING   Incomplete  CULTURE, BLOOD (ROUTINE X 2)     Status: None   Collection Time    06/09/13  9:55 PM      Result Value Range Status   Specimen Description BLOOD LEFT HAND   Final   Special Requests BOTTLES DRAWN AEROBIC ONLY 10CC   Final   Culture  Setup Time     Final   Value: 06/10/2013 11:16     Performed at [facility]   Culture     Final   Value:        BLOOD CULTURE RECEIVED NO GROWTH TO DATE CULTURE WILL BE HELD FOR 5 DAYS BEFORE ISSUING A FINAL NEGATIVE REPORT     Performed at [facility]   Report Status PENDING   Incomplete  BODY FLUID CULTURE     Status: None   Collection Time    06/11/13  2:16 PM      Result Value Range Status   Specimen Description ASCITIC ABDOMEN FLUID   Final   Special Requests FLUID   Final   Gram Stain     Final   Value: RARE WBC PRESENT,BOTH PMN AND MONONUCLEAR     NO ORGANISMS SEEN     Performed at [facility]   Culture     Final   Value: NO GROWTH 3 DAYS     Performed at [facility]   Report Status 06/15/2013 FINAL   Final  MRSA PCR SCREENING     Status: None   Collection Time    06/12/13  7:00 PM      Result Value Range Status   MRSA by PCR NEGATIVE  NEGATIVE Final   Comment:            The GeneXpert MRSA Assay (FDA     approved for NASAL specimens  only), is one component of a     comprehensive MRSA colonization     surveillance program. It is not     intended to diagnose MRSA     infection nor to guide or     monitor treatment for     MRSA infections.    Medications:  Scheduled Meds: . dexamethasone  2 mg Oral Daily  . feeding supplement (ENSURE  COMPLETE)  237 mL Oral BID BM  . folic acid  1 mg Oral Daily  . furosemide  40 mg Oral Daily  . magic mouthwash w/lidocaine  15 mL Oral QID  . pantoprazole  40 mg Oral BID  . potassium chloride SA  40 mEq Oral Daily  . sodium chloride  3 mL Intravenous Q12H  . sorbitol  30 mL Oral QHS  . spironolactone  100 mg Oral Daily  . thiamine  100 mg Oral Daily   Continuous Infusions:  PRN Meds:.sodium chloride, ALPRAZolam, dextromethorphan-guaiFENesin, docusate sodium, levalbuterol, oxyCODONE, sodium chloride  Antibiotics: Anti-infectives   Start     Dose/Rate Route Frequency Ordered Stop   06/09/13 2000  cefTRIAXone (ROCEPHIN) 2 g in dextrose 5 % 50 mL IVPB  Status:  Discontinued     2 g 100 mL/hr over 30 Minutes Intravenous Every 24 hours 06/09/13 1856 06/15/13 0731     Antibiotics Given (last 72 hours)   Date/Time Action Medication Dose Rate   06/13/13 2054 Given   cefTRIAXone (ROCEPHIN) 2 g in dextrose 5 % 50 mL IVPB 2 g 100 mL/hr   06/14/13 2138 Given   cefTRIAXone (ROCEPHIN) 2 g in dextrose 5 % 50 mL IVPB 2 g 100 mL/hr       Day of Hospitalization:  17 Consults: Treatment Team:  Palliative Beola Cord, MD  Assessment/Plan:   #Multiple hepatic masses with omental caking, 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 S/p family meeting with palliative care on 1/2. Patient would now like to be DNR/DNI and pursue a comfort care approach. Plan is discharge to [facility], 06/17/12. - Appreciate very much palliative care recs - Full comfort care, focus on quality of life - No additional interventions or procedures, minimize blood draws - Regular diet for comfort feeding as tolerated - Decadron 2mg  q12h help with his comfort, decrease tumor edema and increase energy level - Magic mouthwash QID - Mucinex DM BID prn for cough - Xanax 0.5 mg every 4 hours as needed for  anxiety - Oxycodone 5 mg every 4 hours when necessary for pain - Colace 100 mg 2 times daily as needed for constipation, sorbitol 30cc daily at bedtime  #SBP - Paracentesis fluid cell count shows 5560 WBC, 46% neutrophils, consistent with SBP. Culture with NGTD, but patient was already on ceftriaxone 2g IV daily for pyelonephritis at the time of the tap. Abdominal pain is gone and all vital signs are stable. He is afebrile. - He has completed a 5 day course of Ceftriaxone 2g IV q24hr for SBP - He has completed a 3 day course of albumin 25% IV - No need for Bactrim prophylaxis going forward given comfort care only  #Hoarseness 2/2 posterior glottic and subgottic inflammation - Patient has suffered from hoarseness and cough since being extubated on 12/23. ENT evaluation showed marked inflammatory, granulation changes of the posterior glottis and subgottis due to intubation, impeding normal vocal fold mobility, and partially blocking the airway. -  Appreciate ENT recs - Discontinue continuous pulse oximetry given new goals of care - Liberalizing to a regular diet for comfort feeding as tolerated - Protonix 40mg  po BID - Discontinue humidified full face mask oxygen for patient comfort, replacing with nasal cannula O2 @5L  with humidity and NaCl 0.9% nebs q4 hours scheduled - Xopenex nebs q6h prn - No need for re-scope next week given change in goals of care  #Weight gain, resolving - Likely secondary to fluid overload and malignant ascites. Scrotal edema and abdominal ascites have mostly resolved. He still has pitting edema in the lower extremities. Weight is down trending with diuretics (see below) and volume status is improving clinically. He is s/p paracentesis on 12/30. Weight on admission was ~105lbs. - Daily weights  - Transitioned to Lasix 40mg  po daily - Spironolactone 100mg  po daily - Continue scrotal support  Filed Weights   06/12/13 0425 06/13/13 1100 06/14/13 1500  Weight: 135 lb 6.4  oz (61.417 kg) 130 lb 1.1 oz (59 kg) 124 lb 1.9 oz (99991111 kg)    #Complicated UTI vs. pyelonephritis - Patient noted to have flank pain, mild dysuria, and gross hematuria on 12/18. UA was positive for nitrites and LE, many bacteria, few squams. Presentation suggestive of UTI vs. Pyelonephritis, likely associated with having a Foley catheter while he was in the ICU. Urine culture grew >100K E. Coli, pan-sensitive. He is now asymptomatic s/p a 5 day course of Ceftriaxone 2g IV q24hr. - Follow up blood culture > NGTD  #Hypernatremia - Likely 2/2 Lasix. - No further blood draws per patient  Sodium  Date Value Range Status  06/14/2013 148* 137 - 147 mEq/L Final     Please note change in reference range.  06/13/2013 149* 137 - 147 mEq/L Final     Please note change in reference range.  06/13/2013 147  137 - 147 mEq/L Final     Please note change in reference range.  06/12/2013 147  137 - 147 mEq/L Final     Please note change in reference range.  06/12/2013 151* 137 - 147 mEq/L Final     Please note change in reference range.    #Leukocytosis - Trending down. Patient has a UTI/pyelonephritis, SBP, and has been on systemic steroids, which we recently tapered off. He is afebrile, AVSS. - No further blood draws per patient  WBC  Date Value Range Status  06/14/2013 15.0* 4.0 - 10.5 K/uL Final  06/13/2013 17.7* 4.0 - 10.5 K/uL Final  06/12/2013 19.1* 4.0 - 10.5 K/uL Final  06/11/2013 17.1* 4.0 - 10.5 K/uL Final  06/10/2013 20.0* 4.0 - 10.5 K/uL Final    #Shock due to perihepatic hemorrhage, resolved - Patient is s/p VT arrest/Code Blue on 12/20 secondary to hemorrhagic shock from perihepatic hemorrhage from subcapsular lesion. IR performed emergent hepatic/mesentericGel-Foam and coil embolization. Vital signs are currently stable. Hgb stable but with slight recent down trend as below, perhaps due to hemorrhoidal bleeding. On 12/27 PT was 14.0, INR 1.10. - No further blood draws per  patient  Hemoglobin  Date Value Range Status  06/14/2013 10.5* 13.0 - 17.0 g/dL Final  06/13/2013 10.8* 13.0 - 17.0 g/dL Final  06/12/2013 11.5* 13.0 - 17.0 g/dL Final  06/11/2013 12.1* 13.0 - 17.0 g/dL Final  06/10/2013 12.0* 13.0 - 17.0 g/dL Final   #Rectal bleeding - On 12/31 nurse noted small amount of noticeable bright red blood mix with soft but formed brown stool. Likely 2/2 hemorrhoidal bleed/portal hypertension due to appearance, in  which case banding is contraindicated. Hemoglobin stable as above.  - No further blood draws per patient  #Hypokalemia-  Stable as below. - Continue K-dur 11meq BID - No further blood draws per patient  Potassium  Date Value Range Status  06/14/2013 4.0  3.7 - 5.3 mEq/L Final     Please note change in reference range.  06/13/2013 4.3  3.7 - 5.3 mEq/L Final     Please note change in reference range.  06/13/2013 3.7  3.7 - 5.3 mEq/L Final     Please note change in reference range.  06/12/2013 3.7  3.7 - 5.3 mEq/L Final     Please note change in reference range.  06/12/2013 3.4* 3.7 - 5.3 mEq/L Final     Please note change in reference range.    #Adrenal insufficiency? - There was concern for adrenal insufficiency when he was in the unit. 06/01/13 Cortisol = 16.1, which is wnl. We have tapered off his steroids since that time.  #Severe malnutrition in the context of chronic illness - Nutrition consulted and recommended ensure complete po bid which was ordered. Prealbumin 7.7 (low). - Appreciate RD recommendations  #Financial issues - Patient is uninsured and does not have a PCP. He rents a room from a friend. - Development worker, community following, Medicaid application has been started  #Code status - DNR/DNI   #Dispo- Disposition is deferred at this time.  Anticipated discharge in approximately 1-3 days to a residential hospice facility.    LOS: 17 days   Lesly Dukes, MD PGY-1, Internal Medicine  [phone] 06/16/2013, 9:43 AM

## 2013-06-17 MED ORDER — MAGIC MOUTHWASH W/LIDOCAINE: 15.0000 mL | Freq: Four times a day (QID) | ORAL | Status: AC

## 2013-06-17 MED ORDER — PANTOPRAZOLE SODIUM 40 MG PO TBEC: 40.0000 mg | DELAYED_RELEASE_TABLET | Freq: Two times a day (BID) | ORAL | Status: AC

## 2013-06-17 MED ORDER — THIAMINE HCL 100 MG PO TABS: 100.0000 mg | ORAL_TABLET | Freq: Every day | ORAL | Status: AC

## 2013-06-17 MED ORDER — FUROSEMIDE 40 MG PO TABS: 40.0000 mg | ORAL_TABLET | Freq: Every day | ORAL | Status: AC

## 2013-06-17 MED ORDER — SPIRONOLACTONE 100 MG PO TABS: 100.0000 mg | ORAL_TABLET | Freq: Every day | ORAL | Status: AC

## 2013-06-17 MED ORDER — FOLIC ACID 1 MG PO TABS: 1.0000 mg | ORAL_TABLET | Freq: Every day | ORAL | Status: AC

## 2013-06-17 MED ORDER — DM-GUAIFENESIN ER 30-600 MG PO TB12: 1.0000 | ORAL_TABLET | Freq: Two times a day (BID) | ORAL | Status: AC | PRN

## 2013-06-17 MED ORDER — LEVALBUTEROL HCL 0.63 MG/3ML IN NEBU: 0.6300 mg | INHALATION_SOLUTION | Freq: Four times a day (QID) | RESPIRATORY_TRACT | Status: AC | PRN

## 2013-06-17 MED ORDER — DEXAMETHASONE 2 MG PO TABS: 2.0000 mg | ORAL_TABLET | Freq: Every day | ORAL | Status: AC

## 2013-06-17 MED ORDER — SORBITOL 70 % SOLN: 30.0000 mL | Freq: Every day | Status: AC

## 2013-06-17 MED ORDER — ALPRAZOLAM 0.5 MG PO TABS: 0.5000 mg | ORAL_TABLET | ORAL | Status: AC | PRN

## 2013-06-17 MED ORDER — DSS 100 MG PO CAPS: 100.0000 mg | ORAL_CAPSULE | Freq: Two times a day (BID) | ORAL | Status: AC | PRN

## 2013-06-17 MED ORDER — POTASSIUM CHLORIDE CRYS ER 20 MEQ PO TBCR: 40.0000 meq | EXTENDED_RELEASE_TABLET | Freq: Every day | ORAL | Status: AC

## 2013-06-17 MED ORDER — OXYCODONE HCL 5 MG PO TABS: 5.0000 mg | ORAL_TABLET | ORAL | Status: AC | PRN

## 2013-06-17 NOTE — Progress Notes (Signed)
Patient discharged to home with instructions, report given to nurse Vernie Ammons at [facility], transported by family members.

## 2013-06-17 NOTE — Consult Note (Signed)
[facility] Liaison: [employer] available for Micheal Daniel today. Meeting family at 11:00 to complete registration paperwork. Will need DC summary faxed to [phone] and RN to call report to [phone]. Please help arrange for Micheal Daniel to arrive at [facility] by noon if possible. Thank you. Erling Conte LCSW [phone]

## 2013-06-17 NOTE — Progress Notes (Signed)
Subjective:   Patient seen and examined at the bedside this morning. He is taking a nap. He feels well on the Decadron and denies abdominal pain, nausea, vomiting, chest pain, SOB. He is still a little hoarse with a sore throat. Plan is to go to [facility] today. He is ready to get out of the hospital.   Objective:   Vital signs in last 24 hours: Filed Vitals:   06/16/13 0511 06/16/13 1357 06/16/13 2118 06/17/13 0621  BP: 137/76 150/56 119/65 131/80  Pulse: 79 70 98 102  Temp: 98 F (36.7 C) 98 F (36.7 C) 99 F (37.2 C) 98.3 F (36.8 C)  TempSrc:  Oral Oral   Resp: 16 19 18 18   Height:      Weight:      SpO2: 98% 100% 98% 100%   Weight change:  No intake or output data in the 24 hours ending 06/17/13 0847  Physical Exam: Constitutional: No distress.  Head: Normocephalic and atraumatic Eyes: PERRL, EOMI, +scleral icterus  Neck: Supple, Trachea midline. Hoarse voice improving. Oropharynx still with some swelling and granulation tissue. Cardiovascular: Regular rate and rhythm, S1, S2 present, no MRG, DP 2+ b/l; improvement in pitting edema up to the thighs b/l, improvement in genital/scrotal edema, still with 2+ pitting edema to ankle Pulmonary/Chest: normal respiratory effort, no inspiratory wheezes in upper airway, improvement in soft crackles at right lung base; no gynecomastia Abdominal: Soft. +BS, no tenderness to palpation, distended Musculoskeletal: No joint deformities, erythema, or stiffness Neurological: A&O x3, cranial nerve II-XII are grossly intact, moving all extremities Skin: Warm, dry and intact. No rash Psychiatric: Normal mood and affect.   Lab Results:  BMP:  Recent Labs Lab 06/13/13 0410 06/13/13 1545 06/14/13 0605  NA 147 149* 148*  K 3.7 4.3 4.0  CL 112 111 111  CO2 20 27 20   GLUCOSE 82 109* 83  BUN 21 19 18   CREATININE 0.89 0.97 0.83  CALCIUM 9.0 9.3 9.1  MG 2.2  --  2.1    CBC:  Recent Labs Lab 06/13/13 0410 06/14/13 0605    WBC 17.7* 15.0*  HGB 10.8* 10.5*  HCT 32.5* 31.1*  MCV 91.3 90.9  PLT 259 295    Coagulation: No results found for this basename: LABPROT, INR,  in the last 168 hours  CBG:            Recent Labs Lab 06/13/13 0854 06/13/13 1230 06/13/13 1649 06/13/13 2130 06/14/13 0747 06/14/13 1205  GLUCAP 77 100* 98 101* 72 103*          LFTs:  Recent Labs Lab 06/12/13 1100  AST 218*  ALT 78*  ALKPHOS 373*  BILITOT 5.2*  PROT 7.0  ALBUMIN 2.1*    Cardiac Enzymes: No results found for this basename: CKTOTAL, CKMB, CKMBINDEX, TROPONINI,  in the last 168 hours Lab Results  Component Value Date   CKTOTAL 232 05/31/2013   CKMB 3.9 05/31/2013   TROPONINI <0.30 06/01/2013    EKG:  Date/Time:  Thursday May 30 2013 09:46:47 EST Ventricular Rate:  82 PR Interval:  138 QRS Duration: 72 QT Interval:  388 QTC Calculation: 453 R Axis:   68 Text Interpretation:  Sinus rhythm Normal ECG No significant change since last tracing Confirmed by [place] ([id number]) on 05/30/2013 10:02:53 AM   Micro Results: Recent Results (from the past 240 hour(s))  URINE CULTURE     Status: None   Collection Time  06/09/13  4:43 PM      Result Value Range Status   Specimen Description URINE, RANDOM   Final   Special Requests NONE   Final   Culture  Setup Time     Final   Value: 06/09/2013 22:56     Performed at [facility]     Final   Value: >=100,000 COLONIES/ML     Performed at [facility]   Culture     Final   Value: ESCHERICHIA COLI     Performed at [facility]   Report Status 06/11/2013 FINAL   Final   Organism ID, Bacteria ESCHERICHIA COLI   Final  CULTURE, BLOOD (ROUTINE X 2)     Status: None   Collection Time    06/09/13  9:45 PM      Result Value Range Status   Specimen Description BLOOD RIGHT ARM   Final   Special Requests BOTTLES DRAWN AEROBIC ONLY 10CC   Final   Culture  Setup Time     Final   Value: 06/10/2013 11:16      Performed at [facility]   Culture     Final   Value: NO GROWTH 5 DAYS     Performed at [facility]   Report Status 06/16/2013 FINAL   Final  CULTURE, BLOOD (ROUTINE X 2)     Status: None   Collection Time    06/09/13  9:55 PM      Result Value Range Status   Specimen Description BLOOD LEFT HAND   Final   Special Requests BOTTLES DRAWN AEROBIC ONLY 10CC   Final   Culture  Setup Time     Final   Value: 06/10/2013 11:16     Performed at [facility]   Culture     Final   Value: NO GROWTH 5 DAYS     Performed at [facility]   Report Status 06/16/2013 FINAL   Final  BODY FLUID CULTURE     Status: None   Collection Time    06/11/13  2:16 PM      Result Value Range Status   Specimen Description ASCITIC ABDOMEN FLUID   Final   Special Requests 60ML FLUID   Final   Gram Stain     Final   Value: RARE WBC PRESENT,BOTH PMN AND MONONUCLEAR     NO ORGANISMS SEEN     Performed at [facility]   Culture     Final   Value: NO GROWTH 3 DAYS     Performed at [facility]   Report Status 06/15/2013 FINAL   Final  MRSA PCR SCREENING     Status: None   Collection Time    06/12/13  7:00 PM      Result Value Range Status   MRSA by PCR NEGATIVE  NEGATIVE Final   Comment:            The GeneXpert MRSA Assay (FDA     approved for NASAL specimens     only), is one component of a     comprehensive MRSA colonization     surveillance program. It is not     intended to diagnose MRSA     infection nor to guide or     monitor treatment for     MRSA infections.    Medications:  Scheduled Meds: . dexamethasone  2 mg Oral Daily  . feeding supplement (  ENSURE COMPLETE)  237 mL Oral BID BM  . folic acid  1 mg Oral Daily  . furosemide  40 mg Oral Daily  . magic mouthwash w/lidocaine  15 mL Oral QID  . pantoprazole  40 mg Oral BID  . potassium chloride SA  40 mEq Oral Daily  . sodium chloride  3 mL Intravenous Q12H  . sorbitol  30 mL Oral QHS   . spironolactone  100 mg Oral Daily  . thiamine  100 mg Oral Daily   Continuous Infusions:  PRN Meds:.sodium chloride, ALPRAZolam, dextromethorphan-guaiFENesin, docusate sodium, levalbuterol, oxyCODONE, sodium chloride  Antibiotics: Anti-infectives   Start     Dose/Rate Route Frequency Ordered Stop   06/09/13 2000  cefTRIAXone (ROCEPHIN) 2 g in dextrose 5 % 50 mL IVPB  Status:  Discontinued     2 g 100 mL/hr over 30 Minutes Intravenous Every 24 hours 06/09/13 1856 06/15/13 0731     Antibiotics Given (last 72 hours)   Date/Time Action Medication Dose Rate   06/14/13 2138 Given   cefTRIAXone (ROCEPHIN) 2 g in dextrose 5 % 50 mL IVPB 2 g 100 mL/hr       Day of Hospitalization:  18 Consults: Treatment Team:  Palliative Beola Cord, MD  Assessment/Plan:   #Multiple hepatic masses with omental caking, 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 S/p family meeting with palliative care on 1/2. Patient would now like to be DNR/DNI and pursue a comfort care approach. Plan is discharge to [facility] today. - Appreciate very much palliative care recs - Full comfort care, focus on quality of life - No additional interventions or procedures, minimize blood draws - Regular diet for comfort feeding as tolerated - Decadron 2mg  q12h help with his comfort, decrease tumor edema and increase energy level - Magic mouthwash QID - Mucinex DM BID prn for cough - Xanax 0.5 mg every 4 hours as needed for anxiety - Oxycodone 5 mg every 4 hours when necessary for pain - Colace 100 mg 2 times daily as needed for constipation, sorbitol 30cc daily at bedtime - Medically stable for discharge to hospice today.  #SBP - Paracentesis fluid cell count shows 5560 WBC, 46% neutrophils, consistent with SBP. Culture with NGTD, but patient was already on ceftriaxone 2g IV daily for pyelonephritis at the  time of the tap. Abdominal pain is gone and all vital signs are stable. He is afebrile. - He has completed a 5 day course of Ceftriaxone 2g IV q24hr for SBP - He has completed a 3 day course of albumin 25% IV - No need for Bactrim prophylaxis going forward given comfort care only  #Hoarseness 2/2 posterior glottic and subgottic inflammation - Patient has suffered from hoarseness and cough since being extubated on 12/23. ENT evaluation showed marked inflammatory, granulation changes of the posterior glottis and subgottis due to intubation, impeding normal vocal fold mobility, and partially blocking the airway. - Appreciate ENT recs - Discontinue continuous pulse oximetry given new goals of care - Liberalizing to a regular diet for comfort feeding as tolerated - Protonix 40mg  po BID - Discontinue humidified full face mask oxygen for patient comfort, replacing with nasal cannula O2 @5L  with humidity and NaCl 0.9% nebs q4 hours scheduled - Xopenex nebs q6h prn - No need for re-scope next week given change in goals of care  #Weight gain, resolving - Likely secondary to fluid overload and malignant  ascites. Scrotal edema and abdominal ascites have mostly resolved. He still has pitting edema in the lower extremities. Weight is down trending with diuretics (see below) and volume status is improving clinically. He is s/p paracentesis on 12/30. Weight on admission was ~105lbs. - Transitioned to Lasix 40mg  po daily - Spironolactone 100mg  po daily - Continue scrotal support  Filed Weights   06/12/13 0425 06/13/13 1100 06/14/13 1500  Weight: 135 lb 6.4 oz (61.417 kg) 130 lb 1.1 oz (59 kg) 124 lb 1.9 oz (99991111 kg)    #Complicated UTI vs. pyelonephritis - Patient noted to have flank pain, mild dysuria, and gross hematuria on 12/18. UA was positive for nitrites and LE, many bacteria, few squams. Presentation suggestive of UTI vs. Pyelonephritis, likely associated with having a Foley catheter while he was in  the ICU. Urine culture grew >100K E. Coli, pan-sensitive. He is now asymptomatic s/p a 5 day course of Ceftriaxone 2g IV q24hr. Blood culture > No growth.  #Hypernatremia - Likely 2/2 Lasix. - No further blood draws per patient  Sodium  Date Value Range Status  06/14/2013 148* 137 - 147 mEq/L Final     Please note change in reference range.  06/13/2013 149* 137 - 147 mEq/L Final     Please note change in reference range.  06/13/2013 147  137 - 147 mEq/L Final     Please note change in reference range.  06/12/2013 147  137 - 147 mEq/L Final     Please note change in reference range.  06/12/2013 151* 137 - 147 mEq/L Final     Please note change in reference range.    #Leukocytosis - Trending down. Patient has a UTI/pyelonephritis, SBP, and has been on systemic steroids, which we recently tapered off. He is afebrile, AVSS. - No further blood draws per patient  WBC  Date Value Range Status  06/14/2013 15.0* 4.0 - 10.5 K/uL Final  06/13/2013 17.7* 4.0 - 10.5 K/uL Final  06/12/2013 19.1* 4.0 - 10.5 K/uL Final  06/11/2013 17.1* 4.0 - 10.5 K/uL Final  06/10/2013 20.0* 4.0 - 10.5 K/uL Final    #Shock due to perihepatic hemorrhage, resolved - Patient is s/p VT arrest/Code Blue on 12/20 secondary to hemorrhagic shock from perihepatic hemorrhage from subcapsular lesion. IR performed emergent hepatic/mesentericGel-Foam and coil embolization. Vital signs are currently stable. Hgb stable but with slight recent down trend as below, perhaps due to hemorrhoidal bleeding. On 12/27 PT was 14.0, INR 1.10. - No further blood draws per patient  Hemoglobin  Date Value Range Status  06/14/2013 10.5* 13.0 - 17.0 g/dL Final  06/13/2013 10.8* 13.0 - 17.0 g/dL Final  06/12/2013 11.5* 13.0 - 17.0 g/dL Final  06/11/2013 12.1* 13.0 - 17.0 g/dL Final  06/10/2013 12.0* 13.0 - 17.0 g/dL Final   #Rectal bleeding - On 12/31 nurse noted small amount of noticeable bright red blood mix with soft but formed brown stool. Likely  2/2 hemorrhoidal bleed/portal hypertension due to appearance, in which case banding is contraindicated. Hemoglobin stable as above.  - No further blood draws per patient  #Hypokalemia-  Stable as below. - Continue K-dur 80meq BID - No further blood draws per patient  Potassium  Date Value Range Status  06/14/2013 4.0  3.7 - 5.3 mEq/L Final     Please note change in reference range.  06/13/2013 4.3  3.7 - 5.3 mEq/L Final     Please note change in reference range.  06/13/2013 3.7  3.7 - 5.3 mEq/L Final  Please note change in reference range.  06/12/2013 3.7  3.7 - 5.3 mEq/L Final     Please note change in reference range.  06/12/2013 3.4* 3.7 - 5.3 mEq/L Final     Please note change in reference range.    #Adrenal insufficiency? - There was concern for adrenal insufficiency when he was in the unit. 06/01/13 Cortisol = 16.1, which is wnl. We have tapered off his steroids since that time.  #Severe malnutrition in the context of chronic illness - Nutrition consulted and recommended ensure complete po bid which was ordered. Prealbumin 7.7 (low). - Appreciate RD recommendations  #Financial issues - Patient is uninsured and does not have a PCP. He rents a room from a friend. - Development worker, community following, Medicaid application has been started  #Code status - DNR/DNI   #Dispo- Disposition is deferred at this time.  Anticipated discharge in approximately 1-3 days to a residential hospice facility.    LOS: 18 days   Lesly Dukes, MD PGY-1, Internal Medicine  [phone] 06/17/2013, 8:47 AM

## 2013-06-17 NOTE — Progress Notes (Signed)
  Date: 06/17/2013  Patient name: Micheal Daniel  Medical record number: [id number]  Date of birth: May 29, 1956   This patient has been seen and the plan of care was discussed with the house staff. Please see their note for complete details. I concur with their findings with the following additions/corrections: Feels well at this time. Denies N/V and abdominal pain. Denies CP and SOB. Plans to go to [facility] today. Appreciate palliative care input. Medically stable for D/C.  Dominic Pea, DO, [facility] 06/17/2013, 2:33 PM

## 2013-06-17 NOTE — Clinical Social Work Note (Signed)
CSW has faxed discharge paperwork to [facility] per liaison request. CSW to call for ambulance transportation once given the "ok" from [facility].   Per [employer], RN to call report at [phone].  Pati Gallo, [place] Social Worker [phone]

## 2013-06-17 NOTE — Discharge Instructions (Signed)
It was a pleasure taking care of you. - We are discharging you to [facility]. Please see the doctor there as soon as possible.   [place] Resource Guide:  No Primary Care Doctor: - Call Health Connect at  [phone] - they can help you locate a primary care doctor that  accepts your insurance, provides certain services, etc. - Physician Referral Service- [phone]  Chronic Pain Problems: Organization         Address  Phone   Notes  [facility]  [phone] Patients need to be referred by their primary care doctor.   Medication Assistance: Organization         Address  Phone   Notes  [facility] Medication [facility] [address] [place], [place] [address] [phone] --Must be a resident of [facility] -- Must have NO insurance coverage whatsoever (no Medicaid/ Medicare, etc.) -- The pt. MUST have a primary care doctor that directs their care regularly and follows them in the community   MedAssist  [phone]   [employer]  [phone]    Agencies that provide inexpensive medical care: Organization         Address  Phone   Notes  [place]  [phone]   [facility]    [phone]   [facility] [place],  [address] [phone]   [place] [address] [place] [phone]   Planned Parenthood    [phone]   [facility]    [phone]   [place] and [place] [address], [place] Phone:  [phone], Fax:  [phone] Hours of Operation:  9 am - 6 pm, M-F.  Also accepts Medicaid/Medicare and self-pay.  [facility] for [address], [address], [place] Phone: [phone], Fax: [phone]. Hours of Operation:  8:30 am - 5:30 pm, M-F.  Also accepts Medicaid and self-pay.  [facility] [place] [address] [place] Phone: [phone]   [place], [place], [place] [phone], Ext. [phone] Mondays & Thursdays: 7-9 AM.  First 15 patients are seen on a first come, first serve basis.    [place] Providers:  Organization         Address  Phone   Notes  [facility] [address], [place] [phone] Also accepts self-pay patients.  [facility] [address], [place]  [phone]   [place], [address], [place] [phone]   [facility] [address], [place] [phone]   Lucianne Lei [address], [place]   [phone] Only accepts [place] Access [place] patients after they have their name applied to their card.   Self-Pay (no insurance) in [facility]:  Organization         Address  Phone   Notes  Sickle Cell Patients, [facility] [place] [phone]   [facility] [place] [phone]   [facility] Urgent [place]  [place], [address], [place] [phone]   [facility]/Dr. Osei-Bonsu  [address], [place] or [place]  Dr, [address], [place] [phone] Phone number for both [facility] and [place] locations is the same.  Urgent Medical and [facility] [address], [place] [phone]   [facility] [address] [place] or [address] [phone]   [facility] [address] [place] [phone], phone; [phone], fax Sees patients 1st and 3rd Saturday of every month.  Must not qualify for public or private insurance (i.e. Medicaid, Medicare, [place] Health Choice, Veterans' Benefits)  Household income should be no more than 200% of the poverty level The clinic  cannot treat you if you are pregnant or think you are pregnant  Sexually transmitted diseases are not treated at the clinic.    Dental Care: Organization         Address  Phone  Notes  [facility] Department of [facility] [place] [phone] Accepts children up to age 12 who are enrolled in [place] or [place]; pregnant women with a Medicaid card; and children who have applied for Medicaid or [place] Health Choice, but were declined, whose parents can pay a reduced fee at time of service.  [facility] Department of [facility]  [address], [place] [phone] Accepts children up to age 66 who are enrolled in [place] or [place]; pregnant women with a Medicaid card; and children who have applied for Medicaid or [place] Health Choice, but were declined, whose parents can pay a reduced fee at time of service.  [place] Adult Dental Access PROGRAM  [place] [phone] Patients are seen by appointment only. Walk-ins are not accepted. [place] will see patients 69 years of age and older. Monday - Tuesday (8am-5pm) Most Wednesdays (8:30-5pm) $30 per visit, cash only  [facility] Adult Dental Access PROGRAM  [address], [facility] [phone] Patients are seen by appointment only. Walk-ins are not accepted. [place] will see patients 65 years of age and older. One Wednesday Evening (Monthly: Volunteer Based).  $30 per visit, cash only  [place]  [phone] for adults; Children under age 89, call Graduate Pediatric Dentistry at [phone]. Children aged 69-14, please call [phone] to request a pediatric application.  Dental services are provided in all areas of dental care including fillings, crowns and bridges, complete and partial dentures, implants, gum treatment, root canals, and extractions. Preventive care is also  provided. Treatment is provided to both adults and children. Patients are selected via a lottery and there is often a waiting list.   [facility] [address], [place]  [phone] [email or web address]   [facility] [address] [place], [place] [phone], Ext. [phone] Second and Fourth Thursday of each month, opens at 6:30 AM; Clinic ends at 9 AM.  Patients are seen on a first-come first-served basis, and a limited number are seen during each clinic.   [facility]  [address] Hillard Danker [place], [place] [phone]   Eligibility Requirements You must have lived in [place], [place], or [place] counties for at least the last three months.   You cannot be eligible for state or federal sponsored [employer], including [employer], [place], or [employer].   You generally cannot be eligible for healthcare insurance through your employer.    How to apply: Eligibility screenings are held  every Tuesday and Wednesday afternoon from 1:00 pm until 4:00 pm. You do not need an appointment for the interview!  [facility] [address] [place], [place]   [place]  [place] Department  [place]  [phone]    Behavioral Health Resources in the Community: Intensive Outpatient Programs Organization         Address  Phone  Notes  [place]. [address] [place], [place] [phone]   [facility]patient [address], [place], [place]   ADS: Alcohol & Drug Svcs [address], [place], [place]   [place] [address]  [place], [place] or [phone]   Substance Abuse Resources Organization         Address  Phone  Notes  Alcohol and Drug Services  [phone]   [place]   [phone]   [facility] [place]   Chinita Pester  [phone]   Residential & Outpatient Substance Abuse Program  [phone]   Psychological Services Organization         Address  Phone  Notes  [facility] [place]  [place]  [phone]   [place] [address] [place] or [phone]    Mobile Crisis Teams Organization         Address  Phone  Notes  [facility], Mobile Crisis Care Unit  [phone]   [facility]ive Psychotherapeutic Services  [address] [place], [place]   [place] Levels [address], [place] [phone]    Self-Help/Support Groups Organization         Address  Phone             Notes  [place]. of [place] - variety of support groups  [place] Call for more information  Narcotics Anonymous (NA), [facility] [address], [employer] [place]  2 meetings at this location   Special educational needs teacher         Address  Phone  Notes  [facility] Residential Treatment [place],    [place]  [phone]   [facility]  [address], [place] [id number], [place], [place]   [place], [place] [phone] Admissions: 8am-3pm M-F  Incentives Substance [place] [address],    [place], [place] [phone]   [facility] [address] [place], [place], [place]   [facility] [address],  [place], [place]   [facility] Programs - Intensive Outpatient [address], [place], [place]   [facility] ([place].,  [place], [place] [phone] or [phone]   [facility] Services (RTS) [address] [place], [place] Accepts Medicaid  [facility] [address],  [place] [phone] Substance  Abuse/Addiction Treatment   [facility] Organization         Address  Phone  Notes  [facility]  [phone]   Domenic Schwab, PhD [address] Arlis Porta [place], [place]   [phone] or [phone]   [place]   [address] [place], [place] [phone]   [place] [address] [place], [place] [phone] Insurance/Medicaid/sponsorship through  [facility]  Faith and Families [address] [place], [place] [phone] [place], [place] [phone]    Dr. Adele Schilder  [phone]   [facility] [place] Dept. 1) [address] [place] 2) [place] 3)  [address], [place] [phone]  [phone]   [place] [phone] or [phone] (After Hours)

## 2013-06-19 ENCOUNTER — Ambulatory Visit: Payer: Self-pay | Admitting: Internal Medicine

## 2013-07-14 DEATH — deceased

## 2015-03-03 IMAGING — US US ABDOMEN COMPLETE
1 series · 13 of 25 positions shown · non-contrast
Comparison: 12/04/2012 abdominal ultrasound.

CLINICAL DATA: Pain.

EXAM:
ULTRASOUND ABDOMEN COMPLETE

[Series 1: us abdomen complete · 0.27mm/px · 13 of 98 slices shown]
[im 1/98]
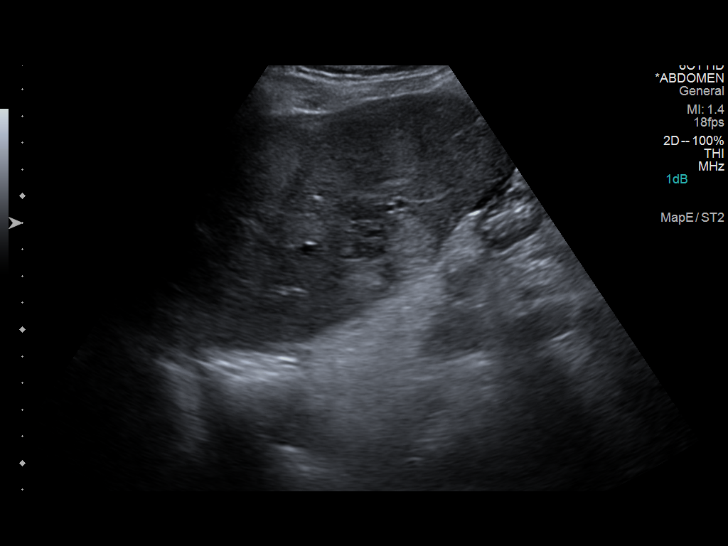
[im 9/98]
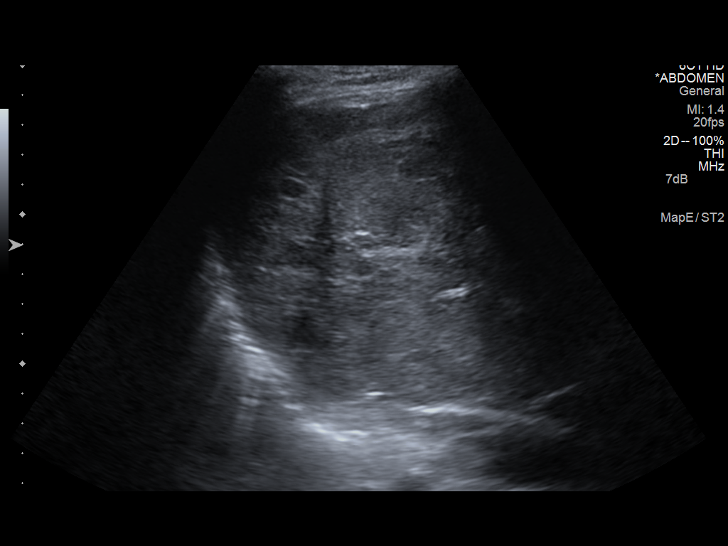
[im 17/98]
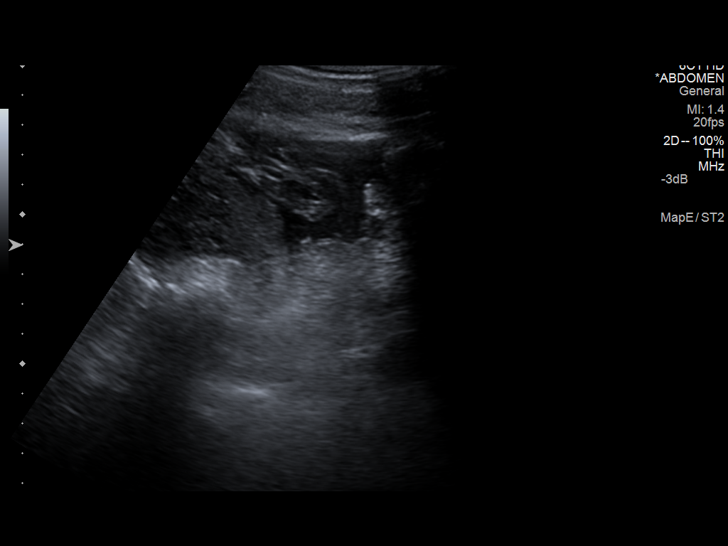
[im 25/98]
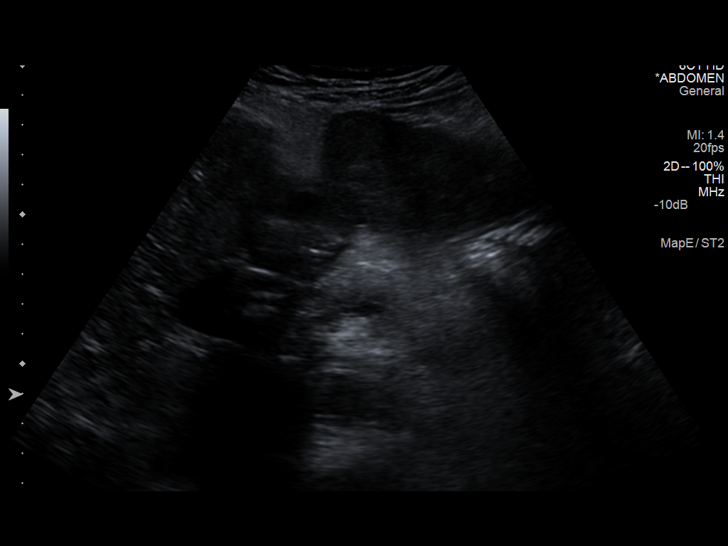
[im 33/98]
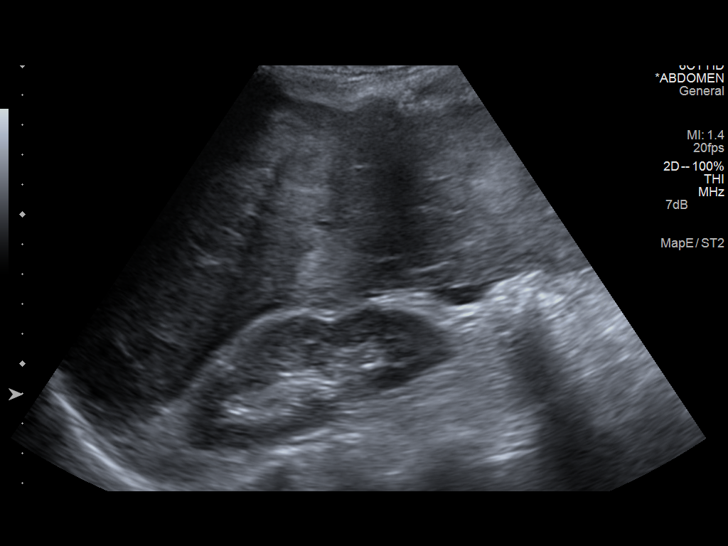
[im 41/98]
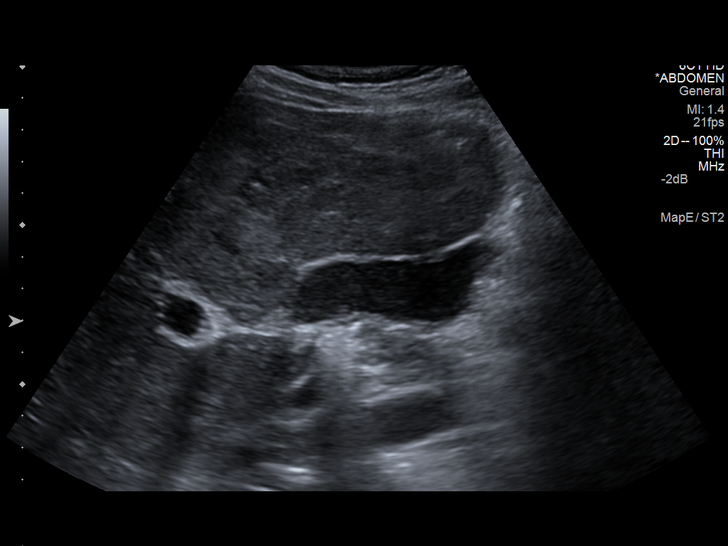
[im 49/98]
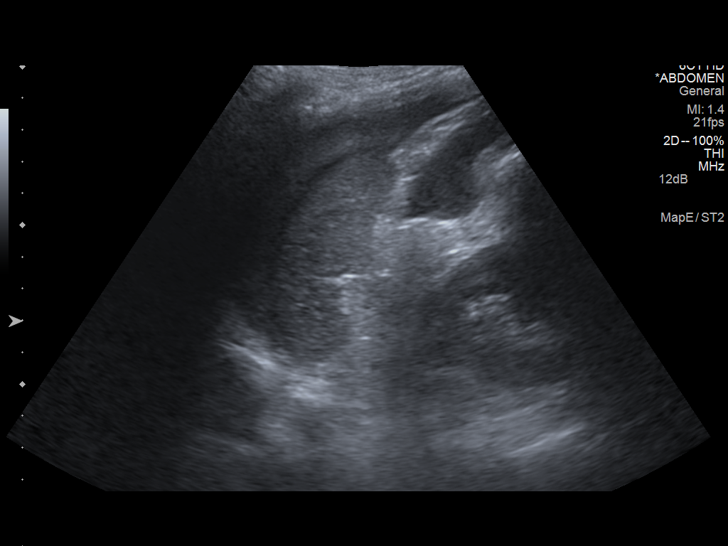
[im 57/98]
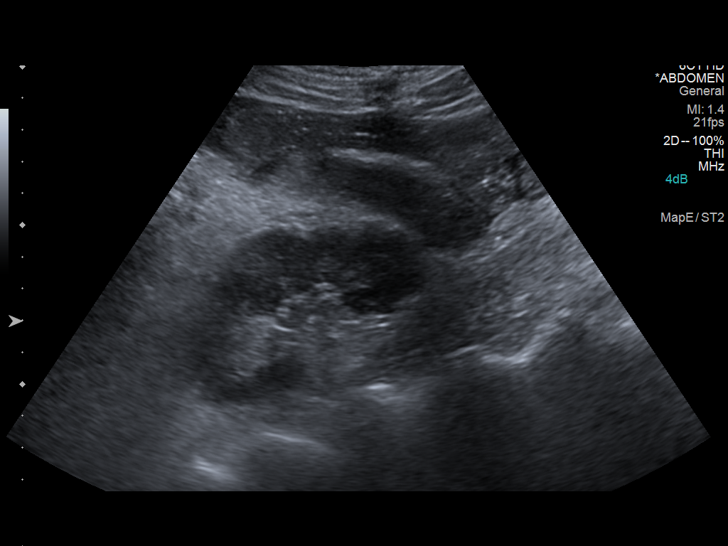
[im 65/98]
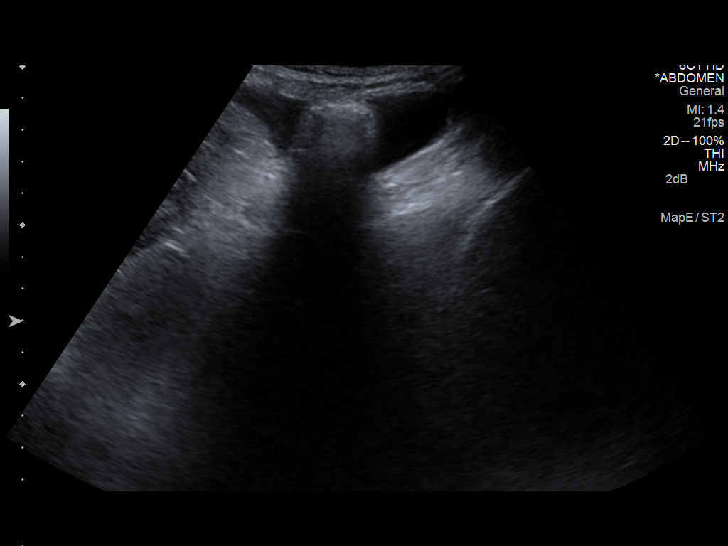
[im 73/98]
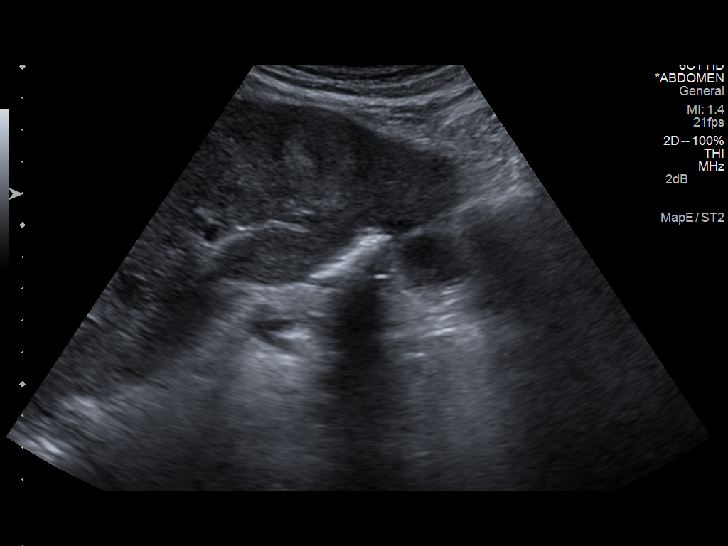
[im 81/98]
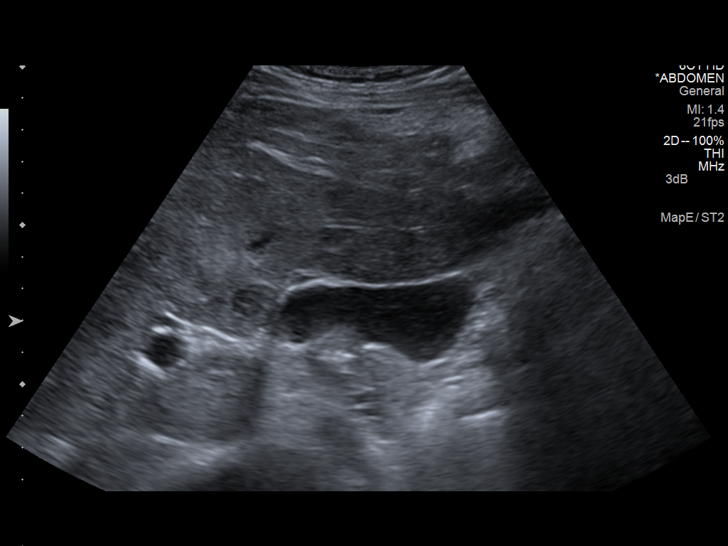
[im 89/98]
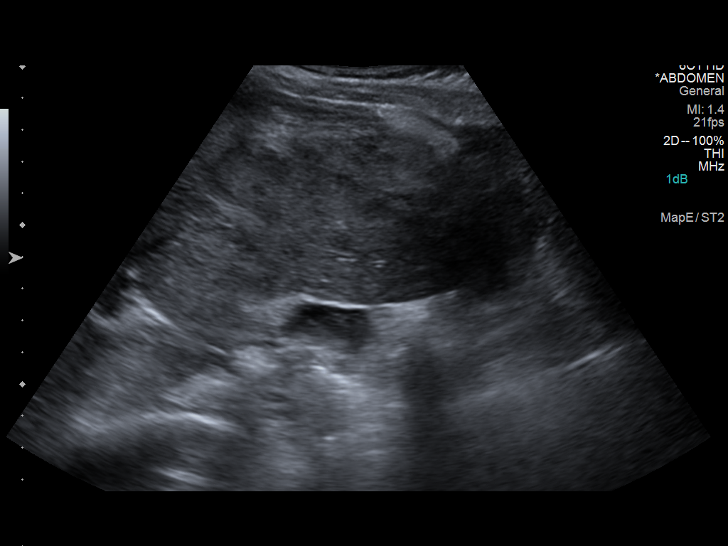
[im 98/98]
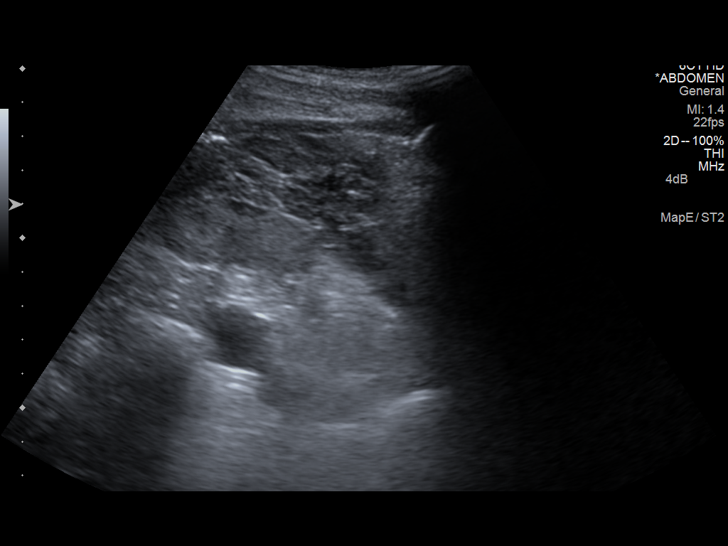

[13 of 25 positions shown; findings below may reference images not displayed]

FINDINGS: Gallbladder:

Again noted is sludge in the gallbladder. Gallbladder wall thickness
is normal at 1.9 mm. Negative Murphy's sign. No pericholecystic
fluid.

Common bile duct:

Diameter: 3.1 mm.

Liver:

Echotexture is grossly abnormal with severe heterogeneity. Probable
hepatomegaly. There are multiple hepatic masses, many of which are
hyperechoic. The largest on today's exam measures 3.9 cm in maximum
diameter. These lesions are very suspicious for metastatic disease.
Portal vein patent with normal directional flow. CT of the abdomen
and pelvis suggested for further evaluation.

IVC:

No abnormality visualized.

Pancreas:

Visualized portion unremarkable.

Spleen:

Size and appearance within normal limits.

Right Kidney:

Length: 9.7 cm. Echogenicity within normal limits. No mass or
hydronephrosis visualized.

Left Kidney:

Length: 9.2 cm. Echogenicity within normal limits. No mass or
hydronephrosis visualized.

Abdominal aorta:

No aneurysm visualized.

Other findings:

Complex ascites.  This suggests malignant ascites.
IMPRESSION: Innumerable hepatic masses consistent with metastatic disease.
Associated ascites present. CT of the abdomen pelvis suggested for
further evaluation.

## 2015-03-05 IMAGING — CR DG CHEST 1V PORT
1 series · 1 of 1 positions shown · non-contrast
Comparison: 05/30/2013.

CLINICAL DATA: Evaluate endotracheal tube placement and central
line placement.

EXAM:
PORTABLE CHEST - 1 VIEW

[AP]
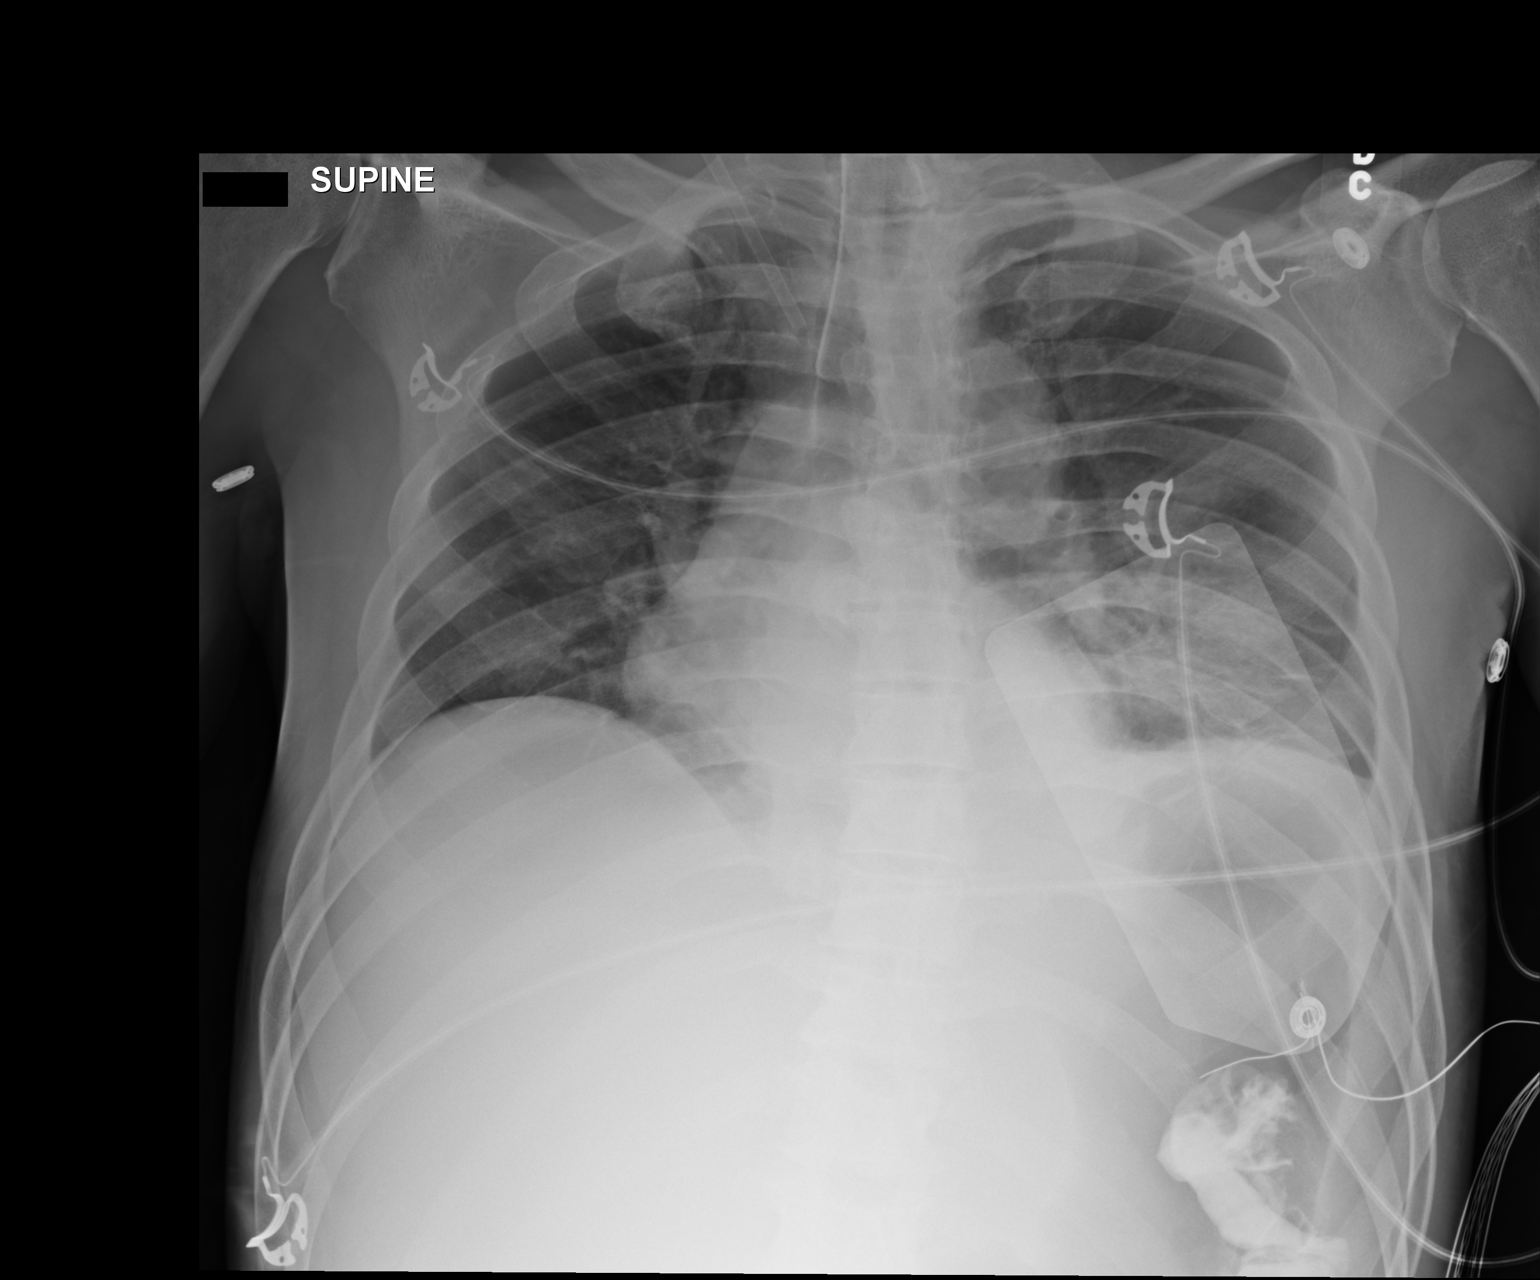

[1 of 1 positions shown; findings below may reference images not displayed]

FINDINGS: Defibrillator pads overlie the chest. Endotracheal tube is present
with the tip 3 cm from the carina. Right IJ vascular sheath is
present. There is no pneumothorax. Left basilar atelectasis. Low
volumes accentuate the size of the cardiopericardial silhouette and
produce tortuosity of the thoracic aorta. Enteric contrast is
present in the left upper quadrant.
IMPRESSION: 1. Endotracheal tube and uncomplicated right IJ vascular sheath.
Endotracheal tube tip 3 cm from the carina.
2. Left basilar atelectasis.

## 2015-03-05 IMAGING — US IR US GUIDE VASC ACCESS LEFT
1 series · 3 of 3 positions shown · non-contrast
Comparison: none

CLINICAL DATA: Massive intraperitoneal hemorrhage secondary to
active extravasation from a a liver lesion. The patient went into
hypovolemic shock and cardiac arrest and was resuscitated. Emergency
consent for hepatic embolization is requested. I am in agreement
with the critical care physician.

[Series 1: ir us guide vasc access left · 3 of 3 slices shown]
[im 1/3]
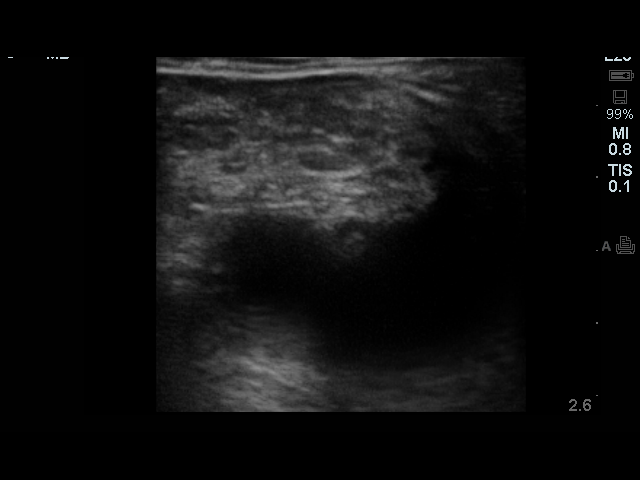
[im 2/3]
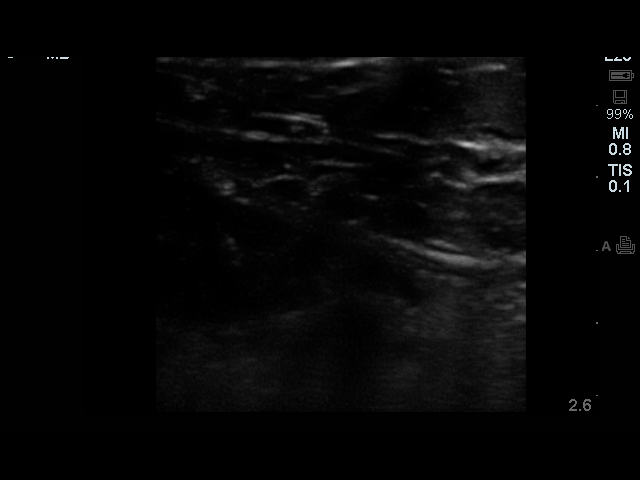
[im 3/3]
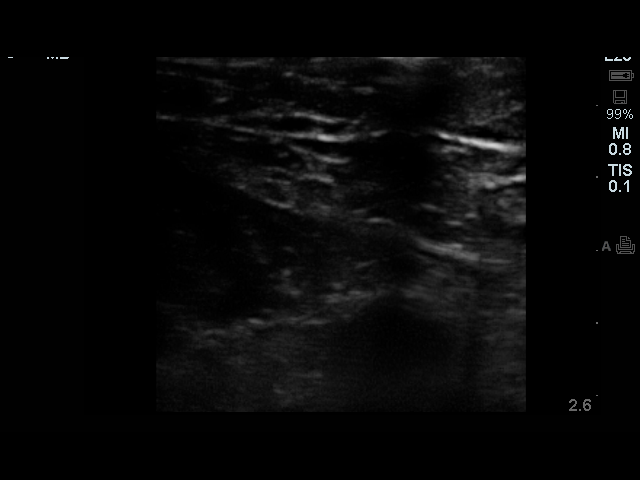

[3 of 3 positions shown; findings below may reference images not displayed]

EXAM:
SELECTIVE VISCERAL ARTERIOGRAPHY; IR ULTRASOUND GUIDANCE VASC ACCESS
LEFT; TRANSCATHETER THERAPY EMBOLIZATION; ARTERIOGRAPHY

MEDICATIONS AND MEDICAL HISTORY:
Ventilated

FLUOROSCOPY TIME:  18 min and 24 seconds.

PROCEDURE:
The procedure, risks, benefits, and alternatives were explained to
the patient. Questions regarding the procedure were encouraged and
answered. The patient understands and consents to the procedure.

The left groin was prepped with Betadine in a sterile fashion, and a
sterile drape was applied covering the operative field. A sterile
gown and sterile gloves were used for the procedure.

Under sonographic guidance, a micropuncture needle was inserted into
the left common femoral artery and removed over a 018 wire which was
up sized to Mavin Josephine. A 5 French sheath was inserted. A cobra 2
catheter was advanced over the wire to the upper aorta. The celiac
was engaged. Cobra was advanced over the wire into the common
hepatic artery. Angiography was performed. This demonstrated active
extravasation in the posterior segment of the right lobe of the
liver.

The cobra catheter was advanced beyond the gastroduodenal artery
origin into the proper hepatic artery and additional angiographic
imaging was performed.

After determining the suspect vessel, a micro catheter was advanced
through the Cobra catheter and over an 018 glidewire into a sub
selective branch in the right lobe of the liver. Gel-Foam slurry was
utilized for embolization. A single 2 x 5 mm coil was deployed.
Follow-up angiogram through the micro catheter demonstrates a
wedge-shaped hypoperfused segment in the right lobe corresponding to
the area of active extravasation.

The micro catheter was then utilized to select the other 2 large
branches to the right lobe can repeat angiography confirmed no other
source of active extravasation.

The cobra catheter was removed. The left groin 5 French sheath was
flushed and secured in place. INR performed this morning was
determined be 2.7. Angiography of the left common femoral artery in
preparation for EXOSEAL was performed.

COMPLICATIONS:
None
FINDINGS: Angiography through the common hepatic artery delineates anatomy.

Subsequent imaging of the proper hepatic artery demonstrates active
extravasation of contrast within the right lobe of the liver
corresponding to the abnormality noted on CT.

Subsequent imaging demonstrates both Gel-Foam and coil embolization
of the suspect vessel. Repeat angiography demonstrates resolution of
the active extravasation.

Subsequent injection of other 3rd order vessels demonstrates no
other source of active extravasation.
IMPRESSION: Diagnostic hepatic arterial angiography confirms active
extravasation of contrast within the right lobe of the liver. The
source vessel was selectively embolize utilizing Gel-Foam slurry and
a single platinum coil. This was successful in eliminating active
extravasation of bleeding from the liver parenchyma.

:
CONTRAST:

100 cc Omnipaque 300

## 2015-03-07 IMAGING — CR DG CHEST 1V PORT
1 series · 1 of 1 positions shown · non-contrast
Comparison: Chest radiograph 06/02/2013.

CLINICAL DATA: Evaluate ET tube position.

EXAM:
PORTABLE CHEST - 1 VIEW

[AP]
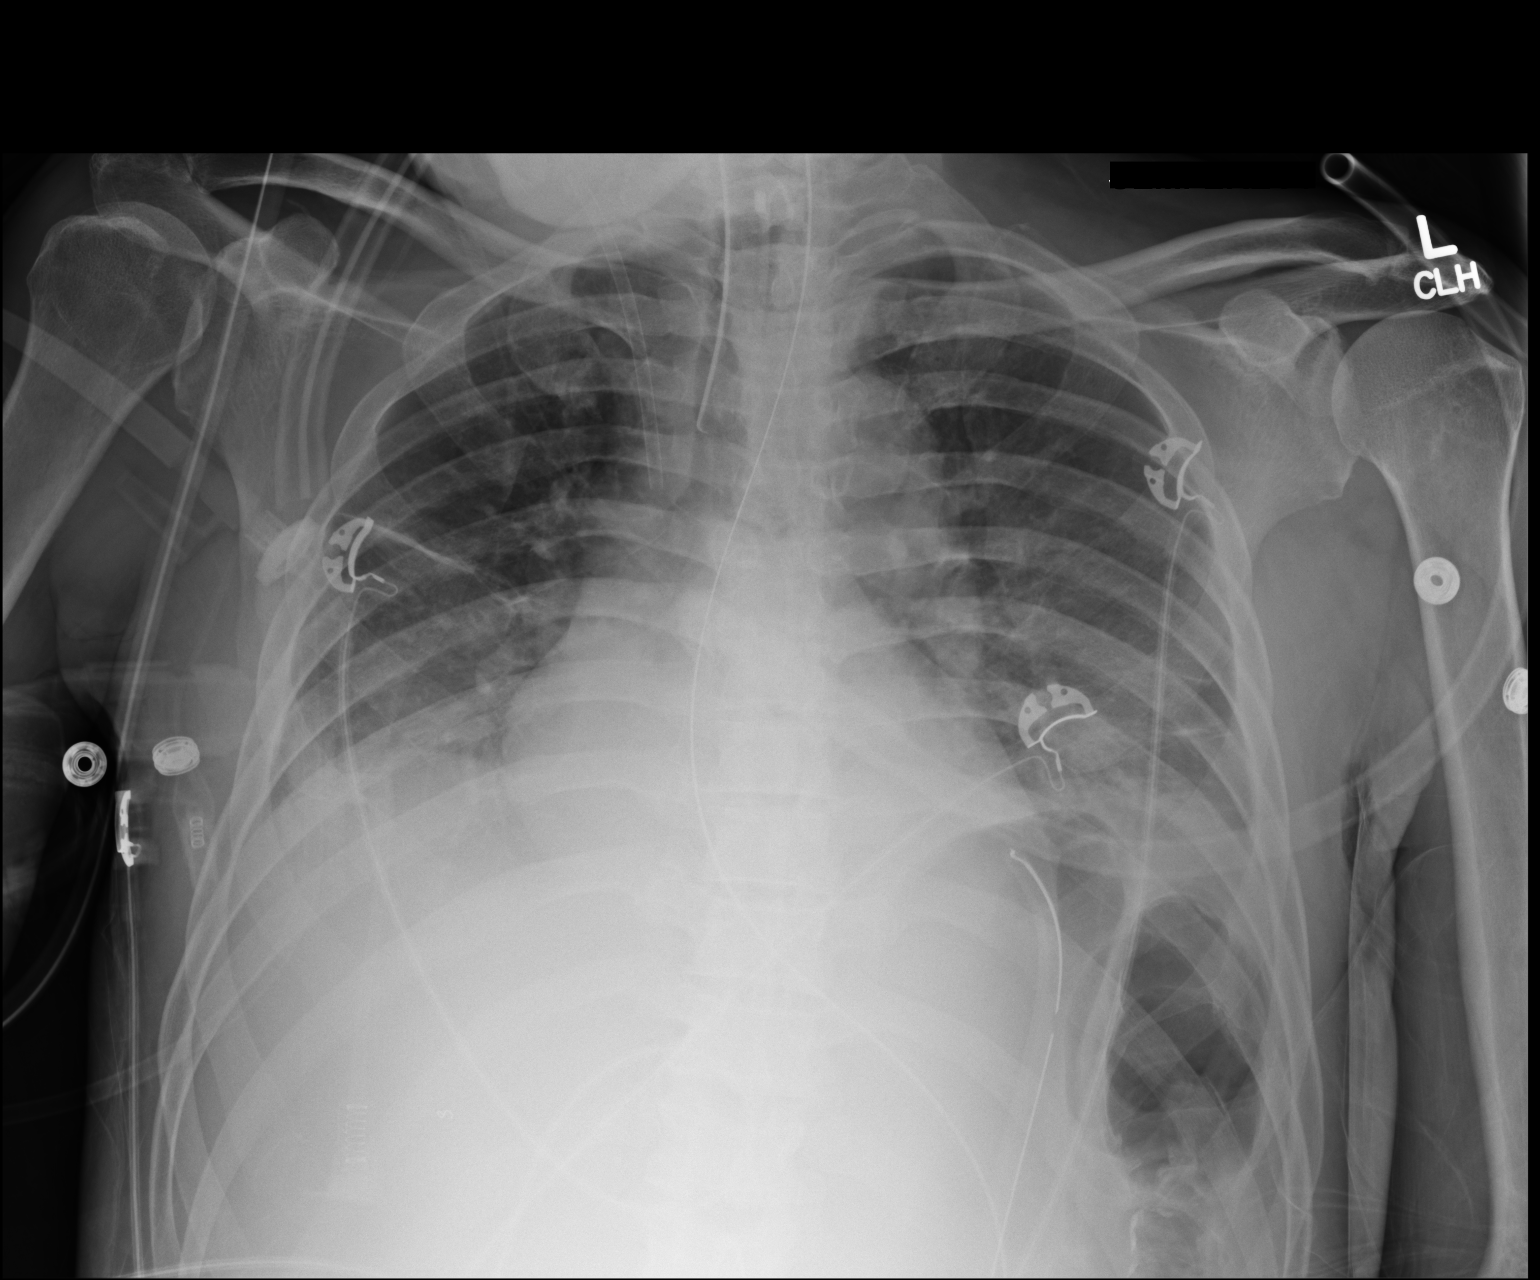

[1 of 1 positions shown; findings below may reference images not displayed]

FINDINGS: ET tube terminates 2.5 cm superior to the carina. NG tube courses
inferior to the diaphragm. Right IJ central venous catheter tip
projects over the superior vena cava. Stable cardiac and mediastinal
contours. Low lung volumes. Bibasilar linear opacities. No pleural
effusion or pneumothorax. Regional skeleton is unremarkable.
IMPRESSION: 1. ET tube terminates 2.5 cm superior to the carina.
2. Low lung volumes with bibasilar atelectasis.

## 2015-03-15 IMAGING — US US PARACENTESIS
1 series · 9 of 9 positions shown · non-contrast
Comparison: None.

CLINICAL DATA: 57-year-old with multiple liver lesions and
hemoperitoneum. Request for diagnostic paracentesis.

EXAM:
ULTRASOUND GUIDED PARACENTESIS

[Series 1: us paracentesis · 0.27mm/px · 9 of 9 slices shown]
[im 1/9]
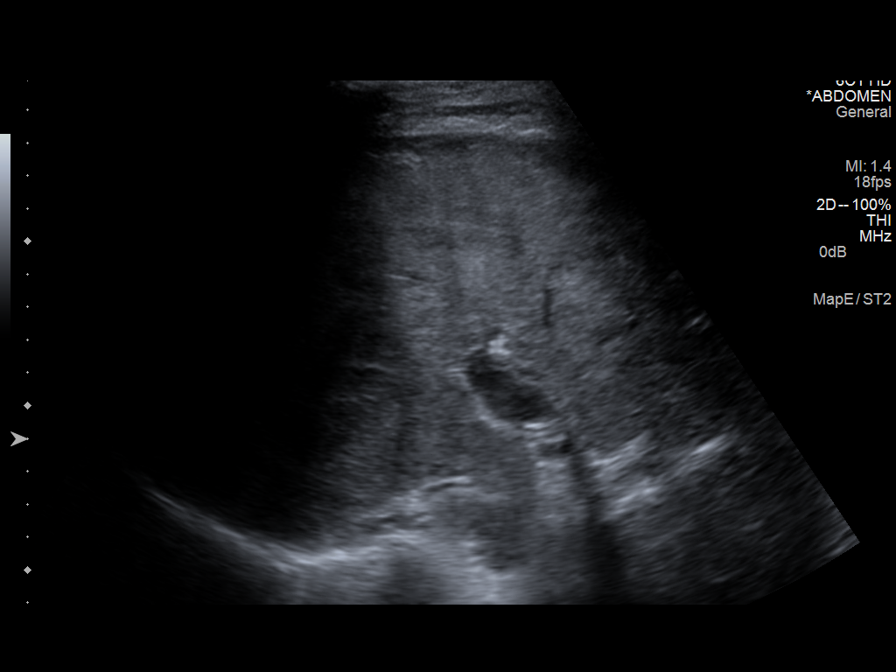
[im 2/9]
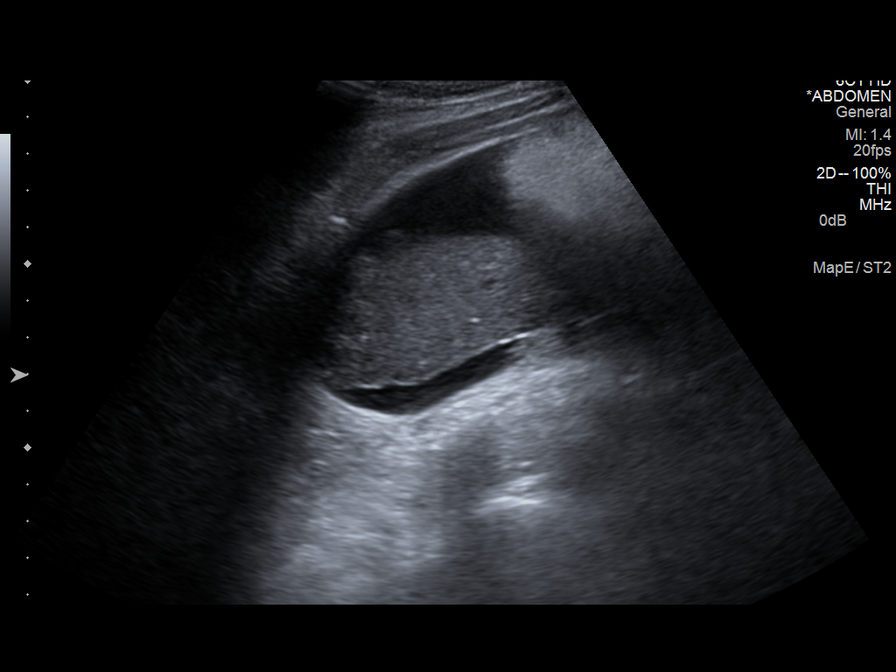
[im 3/9]
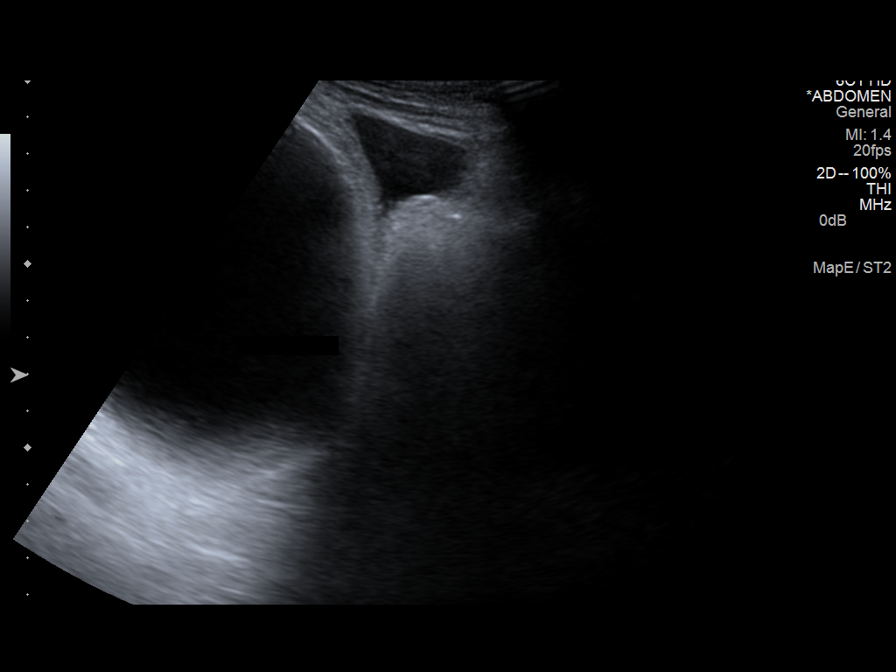
[im 4/9]
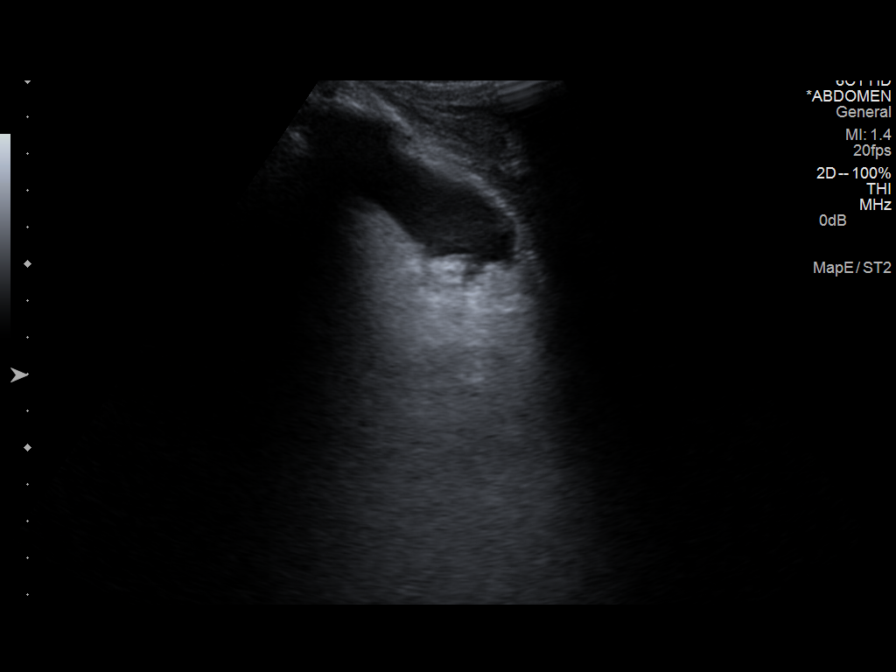
[im 5/9]
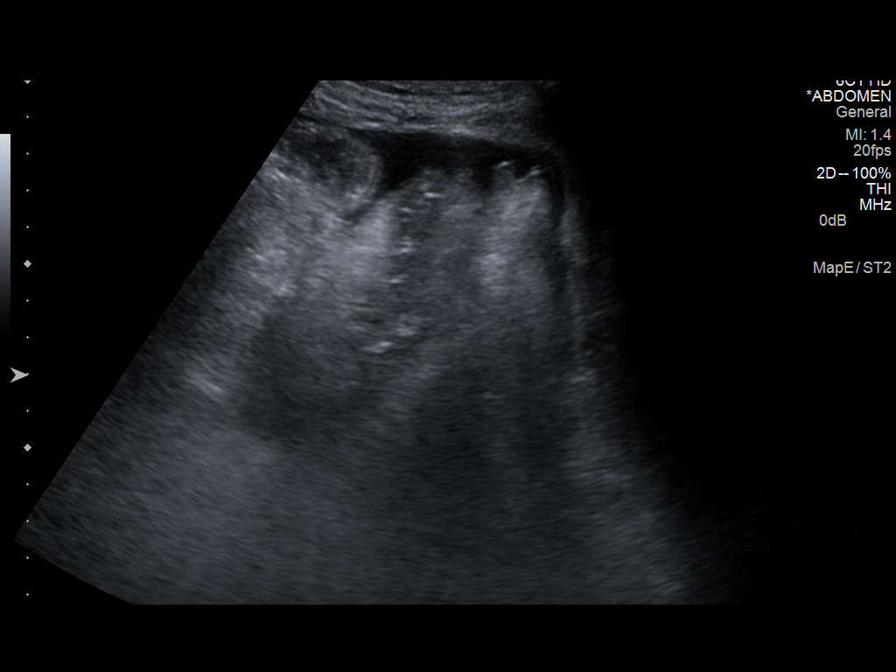
[im 6/9]
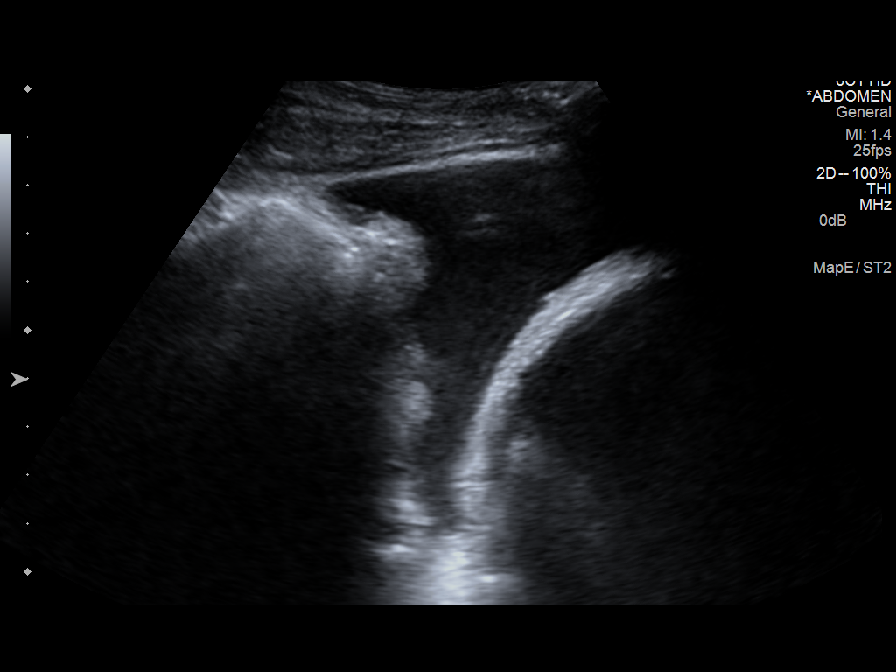
[im 7/9]
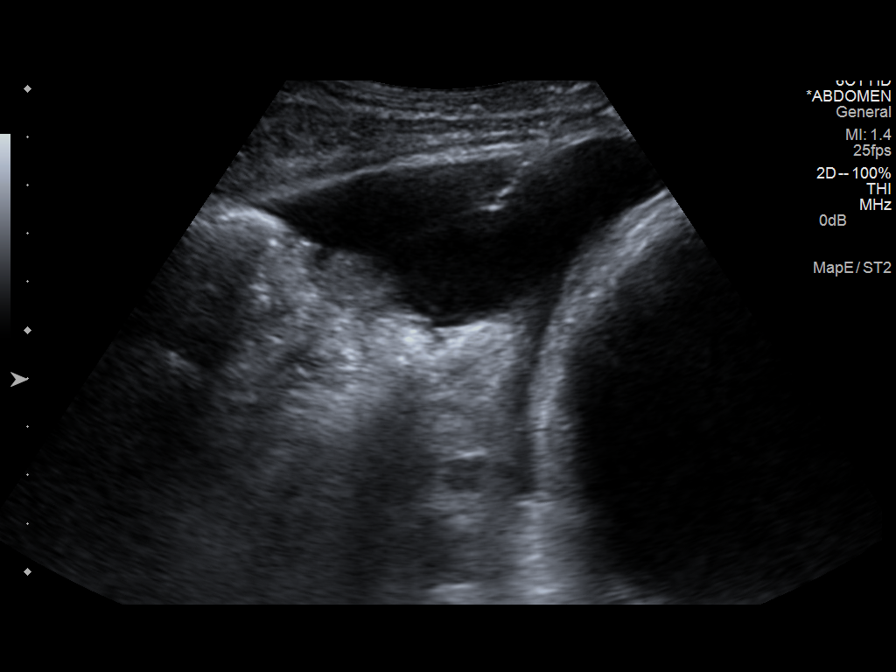
[im 8/9]
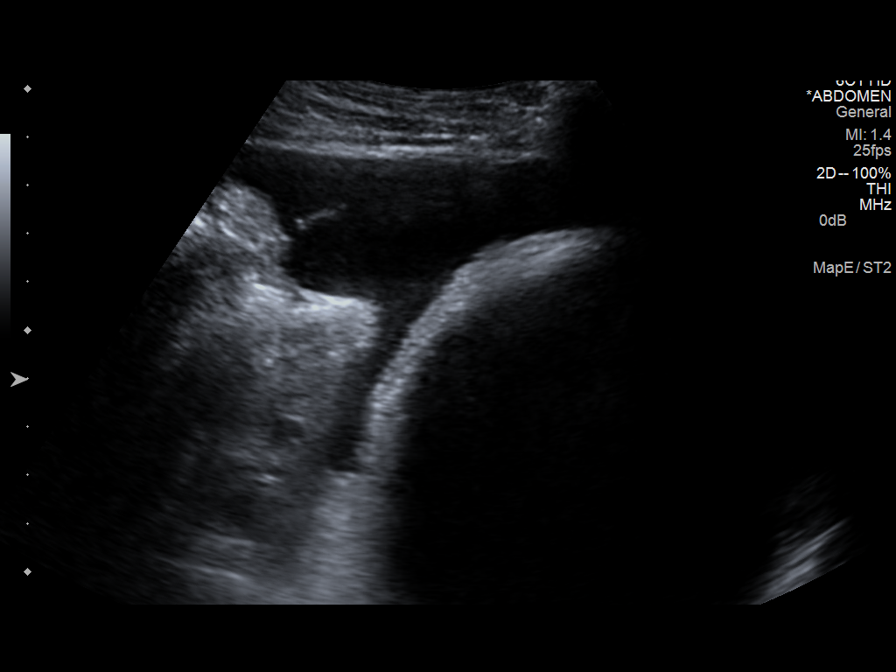
[im 9/9]
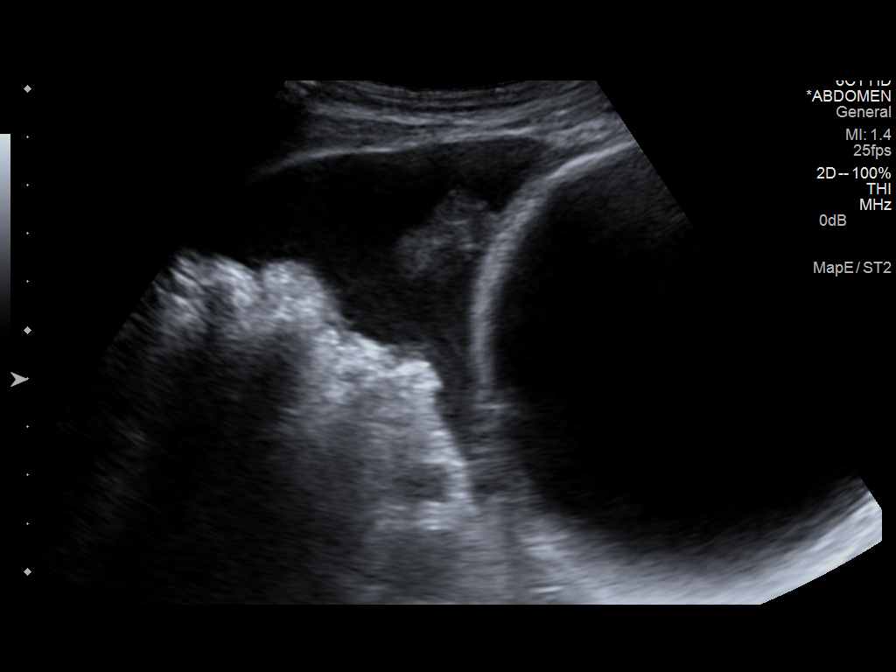

[9 of 9 positions shown; findings below may reference images not displayed]

PROCEDURE:
An ultrasound guided paracentesis was thoroughly discussed with the
patient and questions answered. The benefits, risks, alternatives
and complications were also discussed. The patient understands and
wishes to proceed with the procedure. Written consent was obtained.

Ultrasound was performed to localize and mark an adequate pocket of
fluid in the right lower quadrant of the abdomen. The area was then
prepped and draped in the normal sterile fashion. 1% Lidocaine was
used for local anesthesia. Under ultrasound guidance a 19 gauge Yueh
catheter was introduced. Paracentesis was performed. The catheter
was removed and a dressing applied.

Complications: None.
FINDINGS: A total of 210 ml of bloody fluid was removed. A fluid sample was
sent for laboratory analysis.
IMPRESSION: Successful ultrasound guided paracentesis yielding 210 ml of bloody
fluid.
